# Patient Record
Sex: Female | Born: 1970 | Race: Black or African American | Hispanic: No | Smoking: Never smoker
Health system: Southern US, Community
[De-identification: ages and names within clinical notes are randomized; demographics above are authoritative.]

## PROBLEM LIST (undated history)

## (undated) DIAGNOSIS — I10 Essential (primary) hypertension: Secondary | ICD-10-CM

## (undated) DIAGNOSIS — IMO0002 Reserved for concepts with insufficient information to code with codable children: Secondary | ICD-10-CM

## (undated) DIAGNOSIS — R7303 Prediabetes: Secondary | ICD-10-CM

## (undated) DIAGNOSIS — N939 Abnormal uterine and vaginal bleeding, unspecified: Secondary | ICD-10-CM

## (undated) DIAGNOSIS — B009 Herpesviral infection, unspecified: Secondary | ICD-10-CM

## (undated) DIAGNOSIS — Z973 Presence of spectacles and contact lenses: Secondary | ICD-10-CM

## (undated) DIAGNOSIS — R87619 Unspecified abnormal cytological findings in specimens from cervix uteri: Secondary | ICD-10-CM

## (undated) HISTORY — PX: WISDOM TOOTH EXTRACTION: SHX21

## (undated) HISTORY — DX: Prediabetes: R73.03

## (undated) HISTORY — DX: Reserved for concepts with insufficient information to code with codable children: IMO0002

## (undated) HISTORY — DX: Herpesviral infection, unspecified: B00.9

## (undated) HISTORY — DX: Unspecified abnormal cytological findings in specimens from cervix uteri: R87.619

---

## 1999-08-17 DIAGNOSIS — S060X9A Concussion with loss of consciousness of unspecified duration, initial encounter: Secondary | ICD-10-CM

## 1999-08-17 DIAGNOSIS — S060XAA Concussion with loss of consciousness status unknown, initial encounter: Secondary | ICD-10-CM

## 1999-08-17 HISTORY — DX: Concussion with loss of consciousness status unknown, initial encounter: S06.0XAA

## 1999-08-17 HISTORY — DX: Concussion with loss of consciousness of unspecified duration, initial encounter: S06.0X9A

## 2004-05-19 ENCOUNTER — Other Ambulatory Visit: Admission: RE | Admit: 2004-05-19 | Discharge: 2004-05-19 | Payer: Self-pay | Admitting: Obstetrics and Gynecology

## 2005-06-10 ENCOUNTER — Other Ambulatory Visit: Admission: RE | Admit: 2005-06-10 | Discharge: 2005-06-10 | Payer: Self-pay | Admitting: Obstetrics and Gynecology

## 2006-07-19 ENCOUNTER — Other Ambulatory Visit: Admission: RE | Admit: 2006-07-19 | Discharge: 2006-07-19 | Payer: Self-pay | Admitting: Obstetrics & Gynecology

## 2007-07-24 ENCOUNTER — Other Ambulatory Visit: Admission: RE | Admit: 2007-07-24 | Discharge: 2007-07-24 | Payer: Self-pay | Admitting: Obstetrics and Gynecology

## 2007-08-21 ENCOUNTER — Emergency Department (HOSPITAL_COMMUNITY): Admission: EM | Admit: 2007-08-21 | Discharge: 2007-08-21 | Payer: Self-pay | Admitting: Family Medicine

## 2007-08-24 ENCOUNTER — Emergency Department (HOSPITAL_COMMUNITY): Admission: EM | Admit: 2007-08-24 | Discharge: 2007-08-24 | Payer: Self-pay | Admitting: Emergency Medicine

## 2008-08-23 ENCOUNTER — Emergency Department (HOSPITAL_COMMUNITY): Admission: EM | Admit: 2008-08-23 | Discharge: 2008-08-23 | Payer: Self-pay | Admitting: Family Medicine

## 2008-08-28 ENCOUNTER — Other Ambulatory Visit: Admission: RE | Admit: 2008-08-28 | Discharge: 2008-08-28 | Payer: Self-pay | Admitting: Obstetrics and Gynecology

## 2011-05-06 LAB — DIFFERENTIAL
Basophils Absolute: 0
Basophils Relative: 0
Lymphocytes Relative: 25
Monocytes Relative: 11
Neutro Abs: 3.8
Neutrophils Relative %: 63

## 2011-05-06 LAB — STREP A DNA PROBE: Group A Strep Probe: NEGATIVE

## 2011-05-06 LAB — CBC
MCHC: 34.3
RBC: 4.33
RDW: 12.8

## 2011-05-06 LAB — POCT RAPID STREP A
Streptococcus, Group A Screen (Direct): NEGATIVE
Streptococcus, Group A Screen (Direct): NEGATIVE

## 2012-05-09 ENCOUNTER — Other Ambulatory Visit: Payer: Self-pay | Admitting: Obstetrics and Gynecology

## 2012-05-09 DIAGNOSIS — Z1231 Encounter for screening mammogram for malignant neoplasm of breast: Secondary | ICD-10-CM

## 2012-05-12 ENCOUNTER — Ambulatory Visit
Admission: RE | Admit: 2012-05-12 | Discharge: 2012-05-12 | Disposition: A | Discharge status: Home / Self Care | Payer: 59 | Source: Ambulatory Visit | Attending: Obstetrics and Gynecology | Admitting: Obstetrics and Gynecology

## 2012-05-12 DIAGNOSIS — Z1231 Encounter for screening mammogram for malignant neoplasm of breast: Secondary | ICD-10-CM

## 2012-11-08 ENCOUNTER — Encounter: Payer: Self-pay | Admitting: *Deleted

## 2012-11-27 ENCOUNTER — Ambulatory Visit (INDEPENDENT_AMBULATORY_CARE_PROVIDER_SITE_OTHER): Payer: 59 | Admitting: Nurse Practitioner

## 2012-11-27 ENCOUNTER — Encounter: Payer: Self-pay | Admitting: Nurse Practitioner

## 2012-11-27 VITALS — BP 128/80 | HR 72 | Resp 18 | Ht 67.0 in | Wt 209.0 lb

## 2012-11-27 DIAGNOSIS — Z0000 Encounter for general adult medical examination without abnormal findings: Secondary | ICD-10-CM

## 2012-11-27 DIAGNOSIS — Z01419 Encounter for gynecological examination (general) (routine) without abnormal findings: Secondary | ICD-10-CM

## 2012-11-27 DIAGNOSIS — E559 Vitamin D deficiency, unspecified: Secondary | ICD-10-CM

## 2012-11-27 DIAGNOSIS — Z309 Encounter for contraceptive management, unspecified: Secondary | ICD-10-CM

## 2012-11-27 LAB — POCT URINALYSIS DIPSTICK
Leukocytes, UA: NEGATIVE
Nitrite, UA: NEGATIVE
Protein, UA: NEGATIVE

## 2012-11-27 MED ORDER — NORETHIN-ETH ESTRAD TRIPHASIC 0.5/0.75/1-35 MG-MCG PO TABS: 1.0000 | ORAL_TABLET | Freq: Every day | ORAL | Status: DC

## 2012-11-27 MED ORDER — VITAMIN D (ERGOCALCIFEROL) 1.25 MG (50000 UNIT) PO CAPS: 50000.0000 [IU] | ORAL_CAPSULE | ORAL | Status: DC

## 2012-11-27 NOTE — Progress Notes (Signed)
Patient to call back and verify the telephone number and or address for [employer] that she uses as her Erx failed to transmit during the visit. 

## 2012-11-27 NOTE — Progress Notes (Signed)
Patient to call back and verify the telephone number and or address for [employer] that she uses as her Erx failed to transmit during the visit.

## 2012-11-27 NOTE — Progress Notes (Signed)
42 y.o. G3P0010 Single African American Fe here for annual exam.   Pt. Reports no new problems.  Menses is regular and light.  Same partner for about 11 years. Daughter will graduate High School this year , considering college at [employer].  Patient's last menstrual period was 11/14/2012.          Sexually active: yes  The current method of family planning is OCP (estrogen/progesterone).    Exercising: no  The patient does not participate in regular exercise at present. Smoker:  no  Health Maintenance: Pap:  4/13 normal with neg HR HPV MMG:  9/13 neg Colonoscopy:  NA BMD:   NA TDaP:  07/24/07 Labs: HGB, UA, Vit D   reports that she has never smoked. She has never used smokeless tobacco. She reports that she does not drink alcohol or use illicit drugs.  Past Medical History  Diagnosis Date  . Abnormal pap   . HSV infection     Asymptomatic    History reviewed. No pertinent past surgical history.  Current Outpatient Prescriptions  Medication Sig Dispense Refill  . norethindrone-ethinyl estradiol (TRIPHASIL,CYCLAFEM,ALYACEN) 0.5/0.75/1-35 MG-MCG tablet Take 1 tablet by mouth daily.      . Vitamin D, Ergocalciferol, (DRISDOL) 50000 UNITS CAPS Take 50,000 Units by mouth.       No current facility-administered medications for this visit.    Family History  Problem Relation Age of Onset  . Diabetes Mother   . Hypertension Mother   . Heart disease Mother   . COPD Mother   . Diabetes Father   . Diabetes Maternal Grandmother   . Hypertension Maternal Grandmother   . Heart disease Maternal Grandmother   . Heart disease Paternal Grandfather   . Heart failure Paternal Grandfather     ROS:  Pertinent items are noted in HPI.  Otherwise, a comprehensive ROS was negative.  Exam:   BP 128/80  Pulse 72  Resp 18  Ht 5\' 7"  (1.702 m)  Wt 209 lb (94.802 kg)  BMI 32.73 kg/m2  LMP 11/14/2012  Height: 5\' 7"  (170.2 cm)  Ht Readings from Last 3 Encounters:  11/27/12 5\' 7"  (1.702 m)     General appearance: alert, cooperative and appears stated age Head: Normocephalic, without obvious abnormality, atraumatic Neck: no adenopathy, supple, symmetrical, trachea midline and thyroid normal to inspection and palpation Lungs: clear to auscultation bilaterally Breasts: normal appearance, no masses or tenderness Heart: regular rate and rhythm Abdomen: soft, non-tender; no masses,  no organomegaly Extremities: extremities normal, atraumatic, no cyanosis or edema Skin: Skin color, texture, turgor normal. No rashes or lesions Lymph nodes: Cervical, supraclavicular, and axillary nodes normal. No abnormal inguinal nodes palpated Neurologic: Grossly normal   Pelvic: External genitalia:  no lesions              Urethra:  normal appearing urethra with no masses, tenderness or lesions              Bartholin's and Skene's: normal                 Vagina: normal appearing vagina with normal color and discharge, no lesions              Cervix: anteverted              Pap taken: no Bimanual Exam:  Uterus:  normal size, contour, position, consistency, mobility, non-tender              Adnexa: no mass, fullness, tenderness  Rectovaginal: Confirms               Anus:  normal sphincter tone, no lesions  A:  Well Woman with normal exam  No contraindications to continued OCP use  Vit D Def.  P:   Mammogram due 04/2013  pap smear as per guidelines  RF OCP and Vit D  return annually or prn  An After Visit Summary was printed and given to the patient.

## 2012-11-27 NOTE — Patient Instructions (Signed)

## 2012-11-28 ENCOUNTER — Telehealth: Payer: Self-pay | Admitting: *Deleted

## 2012-11-28 NOTE — Telephone Encounter (Signed)
Pt. Aware of vitamin d results and will begin vitamin d (otc) 800 IU po qd.

## 2012-11-28 NOTE — Telephone Encounter (Signed)
Message copied by Osie Bond on Tue Nov 28, 2012  4:21 PM ------      Message from: Ria Comment R      Created: Tue Nov 28, 2012  4:16 PM       Follow vit d protocol, let pt know, has a new rx given yesterday ------

## 2012-11-28 NOTE — Progress Notes (Signed)
Encounter reviewed by Dr. Brook Silva.  

## 2012-11-30 ENCOUNTER — Telehealth: Payer: Self-pay | Admitting: *Deleted

## 2012-11-30 LAB — HEMOGLOBIN, FINGERSTICK: Hemoglobin, fingerstick: 12.7 g/dL (ref 12.0–16.0)

## 2012-11-30 MED ORDER — METRONIDAZOLE 0.75 % EX GEL: CUTANEOUS | Status: DC

## 2012-11-30 NOTE — Telephone Encounter (Signed)
Call to [facility] for Rx for Metrogel 0.75% 1 applicatorful PV QHS x 5 days. Disp 1 tube with 0 RF.

## 2012-11-30 NOTE — Telephone Encounter (Signed)
Spoke with pt about Dr Rondel Baton recommendation about Metrogel. Instructed pt to use an applicatorful every night vaginally at bedtime for 5 days. Pt would [facility] [address]

## 2012-11-30 NOTE — Telephone Encounter (Signed)
Pt married.  Ok to call in RX for metrogel 0.75% one applicator QHS x 5 days.  0RF.  If symptoms do not resolve, will need OV.

## 2012-11-30 NOTE — Telephone Encounter (Signed)
Patient was seen past Monday for AEX by Shirlyn Goltz, FNP, . Patient called this morning states with brown vaginal discharge with fish odor. No itching , no burning with urination, states has a history of bacterial vaginitis. Please advise. sue

## 2012-12-04 ENCOUNTER — Encounter: Payer: Self-pay | Admitting: *Deleted

## 2012-12-05 NOTE — Progress Notes (Signed)
Patient states se spoke to Tiffany and [facility] her 

## 2012-12-05 NOTE — Progress Notes (Signed)
Patient states se spoke to [place] and took [facility] with her

## 2012-12-18 ENCOUNTER — Ambulatory Visit (INDEPENDENT_AMBULATORY_CARE_PROVIDER_SITE_OTHER): Payer: 59 | Admitting: Nurse Practitioner

## 2012-12-18 ENCOUNTER — Encounter: Payer: Self-pay | Admitting: Nurse Practitioner

## 2012-12-18 VITALS — BP 102/58 | HR 64 | Resp 14 | Wt 212.2 lb

## 2012-12-18 DIAGNOSIS — B9689 Other specified bacterial agents as the cause of diseases classified elsewhere: Secondary | ICD-10-CM

## 2012-12-18 DIAGNOSIS — N760 Acute vaginitis: Secondary | ICD-10-CM

## 2012-12-18 DIAGNOSIS — A499 Bacterial infection, unspecified: Secondary | ICD-10-CM

## 2012-12-18 LAB — POCT WET PREP (WET MOUNT)

## 2012-12-18 MED ORDER — METRONIDAZOLE 500 MG PO TABS: 500.0000 mg | ORAL_TABLET | Freq: Two times a day (BID) | ORAL | Status: DC

## 2012-12-18 MED ORDER — METRONIDAZOLE 0.75 % VA GEL: 1.0000 | Freq: Every day | VAGINAL | Status: DC

## 2012-12-18 NOTE — Patient Instructions (Signed)
Bacterial Vaginosis Bacterial vaginosis (BV) is a vaginal infection where the normal balance of bacteria in the vagina is disrupted. The normal balance is then replaced by an overgrowth of certain bacteria. There are several different kinds of bacteria that can cause BV. BV is the most common vaginal infection in women of childbearing age. CAUSES   The cause of BV is not fully understood. BV develops when there is an increase or imbalance of harmful bacteria.  Some activities or behaviors can upset the normal balance of bacteria in the vagina and put women at increased risk including:  Having a new sex partner or multiple sex partners.  Douching.  Using an intrauterine device (IUD) for contraception.  It is not clear what role sexual activity plays in the development of BV. However, women that have never had sexual intercourse are rarely infected with BV. Women do not get BV from toilet seats, bedding, swimming pools or from touching objects around them.  SYMPTOMS   Grey vaginal discharge.  A fish-like odor with discharge, especially after sexual intercourse.  Itching or burning of the vagina and vulva.  Burning or pain with urination.  Some women have no signs or symptoms at all. DIAGNOSIS  Your caregiver must examine the vagina for signs of BV. Your caregiver will perform lab tests and look at the sample of vaginal fluid through a microscope. They will look for bacteria and abnormal cells (clue cells), a pH test higher than 4.5, and a positive amine test all associated with BV.  RISKS AND COMPLICATIONS   Pelvic inflammatory disease (PID).  Infections following gynecology surgery.  Developing HIV.  Developing herpes virus. TREATMENT  Sometimes BV will clear up without treatment. However, all women with symptoms of BV should be treated to avoid complications, especially if gynecology surgery is planned. Female partners generally do not need to be treated. However, BV may spread  between female sex partners so treatment is helpful in preventing a recurrence of BV.   BV may be treated with antibiotics. The antibiotics come in either pill or vaginal cream forms. Either can be used with nonpregnant or pregnant women, but the recommended dosages differ. These antibiotics are not harmful to the baby.  BV can recur after treatment. If this happens, a second round of antibiotics will often be prescribed.  Treatment is important for pregnant women. If not treated, BV can cause a premature delivery, especially for a pregnant woman who had a premature birth in the past. All pregnant women who have symptoms of BV should be checked and treated.  For chronic reoccurrence of BV, treatment with a type of prescribed gel vaginally twice a week is helpful. HOME CARE INSTRUCTIONS   Finish all medication as directed by your caregiver.  Do not have sex until treatment is completed.  Tell your sexual partner that you have a vaginal infection. They should see their caregiver and be treated if they have problems, such as a mild rash or itching.  Practice safe sex. Use condoms. Only have 1 sex partner. PREVENTION  Basic prevention steps can help reduce the risk of upsetting the natural balance of bacteria in the vagina and developing BV:  Do not have sexual intercourse (be abstinent).  Do not douche.  Use all of the medicine prescribed for treatment of BV, even if the signs and symptoms go away.  Tell your sex partner if you have BV. That way, they can be treated, if needed, to prevent reoccurrence. SEEK MEDICAL CARE IF:     Your symptoms are not improving after 3 days of treatment.  You have increased discharge, pain, or fever. MAKE SURE YOU:   Understand these instructions.  Will watch your condition.  Will get help right away if you are not doing well or get worse. FOR MORE INFORMATION  Division of STD Prevention (DSTDP), Centers for Disease Control and Prevention:  www.cdc.gov/std American Social Health Association (ASHA): www.ashastd.org  Document Released: 08/02/2005 Document Revised: 10/25/2011 Document Reviewed: 01/23/2009 ExitCare Patient Information 2013 [employer].  

## 2012-12-18 NOTE — Progress Notes (Signed)
Subjective:     Patient ID: Sarah Keller, female   DOB: [birth year], 42 y.o.   MRN: [id number]  Vaginal Discharge The patient's primary symptoms include a genital odor and a vaginal discharge. The patient's pertinent negatives include no genital itching, genital lesions, genital rash, missed menses, pelvic pain or vaginal bleeding. Primary symptoms comment: Vagina Symptoms for 1 week.  More symptoms of BV than yeast. With malodorous  discharge and  thin discharge.. This is a recurrent problem. The current episode started in the past 7 days. The problem has been gradually worsening. The patient is experiencing no pain. She is not pregnant. Pertinent negatives include no abdominal pain, anorexia, back pain, chills, constipation, diarrhea, discolored urine, dysuria, fever, flank pain, frequency, headaches, hematuria, joint pain, joint swelling, nausea, painful intercourse, rash, sore throat, urgency or vomiting. The vaginal discharge was clear, copious, grey, malodorous, mucoid, thick, watery and yellow. Vaginal bleeding amount: LMP 12/12/12 normal. She has not been passing clots (On ON 777 for birth control.). Treatments tried: Metrogel recently 11/24/12 without resolution of symptoms and only mild relief. She is sexually active. No, her partner does not have an STD. She uses oral contraceptives for contraception. Her menstrual history has been regular. Her past medical history is significant for an STD and vaginosis. (History of HSV and frequent BV. Partner has been treated in past but did not seem to make a difference in her recurrent symptoms. )     Review of Systems  Constitutional: Negative.  Negative for fever and chills.  HENT: Negative for sore throat.   Respiratory: Negative.   Cardiovascular: Negative.   Gastrointestinal: Negative.  Negative for nausea, vomiting, abdominal pain, diarrhea, constipation and anorexia.  Endocrine: Negative.   Genitourinary: Positive for vaginal discharge. Negative  for dysuria, urgency, frequency, hematuria, flank pain, pelvic pain and missed menses.  Musculoskeletal: Negative for back pain and joint pain.  Skin: Negative for rash.  Neurological: Negative for headaches.  Psychiatric/Behavioral: Negative.        Objective:   Physical Exam  Constitutional: She is oriented to person, place, and time. She appears well-developed and well-nourished.  Cardiovascular: Normal rate.   Pulmonary/Chest: Effort normal.  Abdominal: Soft. She exhibits no distension and no mass. There is no tenderness. There is no rebound.  Genitourinary: Vaginal discharge found.  White thin creamy vaginal discharge. No lesions.  Musculoskeletal: Normal range of motion.  Neurological: She is alert and oriented to person, place, and time.  Skin: Skin is warm and dry.  Psychiatric: She has a normal mood and affect. Her behavior is normal. Judgment and thought content normal.       Assessment:    BV acute and chronic Same partner for years    Plan:     Patient does better with Flagyl po- 1 tab 2 day for 1 week. Aware of ETOH and GI precautions. Since this is a prolonged problem for her will try Metrogel after sexual activity to see if helps to keep symptoms from recurring. RTO prn

## 2012-12-20 NOTE — Progress Notes (Signed)
Encounter reviewed by Dr. Brook Silva.  

## 2013-01-29 ENCOUNTER — Other Ambulatory Visit: Payer: Self-pay | Admitting: *Deleted

## 2013-01-29 DIAGNOSIS — Z309 Encounter for contraceptive management, unspecified: Secondary | ICD-10-CM

## 2013-01-29 MED ORDER — NORETHIN-ETH ESTRAD TRIPHASIC 0.5/0.75/1-35 MG-MCG PO TABS: 1.0000 | ORAL_TABLET | Freq: Every day | ORAL | Status: DC

## 2013-01-29 NOTE — Telephone Encounter (Signed)
Faxed refill request received from pharmacy for [facility] Last filled by MD on 11/27/12 Last AEX - 11/27/12 Next AEX - 11/28/13 Pharmacy did not receive RX sent during AEX.  Refilled x 1 year per last AEX note.

## 2013-05-07 ENCOUNTER — Other Ambulatory Visit: Payer: Self-pay

## 2013-05-07 DIAGNOSIS — Z1231 Encounter for screening mammogram for malignant neoplasm of breast: Secondary | ICD-10-CM

## 2013-05-30 ENCOUNTER — Ambulatory Visit
Admission: RE | Admit: 2013-05-30 | Discharge: 2013-05-30 | Disposition: A | Discharge status: Home / Self Care | Payer: 59 | Source: Ambulatory Visit

## 2013-05-30 DIAGNOSIS — Z1231 Encounter for screening mammogram for malignant neoplasm of breast: Secondary | ICD-10-CM

## 2013-06-04 ENCOUNTER — Other Ambulatory Visit: Payer: Self-pay | Admitting: Obstetrics & Gynecology

## 2013-06-04 DIAGNOSIS — R928 Other abnormal and inconclusive findings on diagnostic imaging of breast: Secondary | ICD-10-CM

## 2013-06-19 ENCOUNTER — Ambulatory Visit
Admission: RE | Admit: 2013-06-19 | Discharge: 2013-06-19 | Disposition: A | Discharge status: Home / Self Care | Payer: Self-pay | Source: Ambulatory Visit | Attending: Obstetrics & Gynecology | Admitting: Obstetrics & Gynecology

## 2013-06-19 ENCOUNTER — Telehealth: Payer: Self-pay | Admitting: Obstetrics & Gynecology

## 2013-06-19 ENCOUNTER — Other Ambulatory Visit: Payer: Self-pay | Admitting: Orthopedic Surgery

## 2013-06-19 DIAGNOSIS — R928 Other abnormal and inconclusive findings on diagnostic imaging of breast: Secondary | ICD-10-CM

## 2013-06-19 NOTE — Telephone Encounter (Signed)
Baird Lyons from the breast center is calling about a referral for a mammogram. She needs miller to sign the order for the patient either electronically or on the fax she sent. She said she sent a ultrasound with it and that one was signed but not the mammogram.

## 2013-06-19 NOTE — Telephone Encounter (Signed)
Dr. Hyacinth Meeker,  Could you sign MMG order?

## 2013-06-20 NOTE — Telephone Encounter (Signed)
Mammogram was completed yesterday.

## 2013-08-31 ENCOUNTER — Ambulatory Visit (INDEPENDENT_AMBULATORY_CARE_PROVIDER_SITE_OTHER): Payer: BC Managed Care – PPO | Admitting: Obstetrics & Gynecology

## 2013-08-31 ENCOUNTER — Encounter: Payer: Self-pay | Admitting: Obstetrics & Gynecology

## 2013-08-31 VITALS — BP 118/62 | HR 68 | Resp 16 | Ht 67.0 in | Wt 216.2 lb

## 2013-08-31 DIAGNOSIS — N390 Urinary tract infection, site not specified: Secondary | ICD-10-CM

## 2013-08-31 LAB — POCT URINALYSIS DIPSTICK
BILIRUBIN UA: NEGATIVE
GLUCOSE UA: NEGATIVE
Ketones, UA: NEGATIVE
Leukocytes, UA: NEGATIVE
Nitrite, UA: NEGATIVE
Protein, UA: NEGATIVE
UROBILINOGEN UA: NEGATIVE
pH, UA: 5

## 2013-08-31 MED ORDER — FLUCONAZOLE 150 MG PO TABS: 150.0000 mg | ORAL_TABLET | Freq: Once | ORAL | Status: DC

## 2013-08-31 MED ORDER — SULFAMETHOXAZOLE-TRIMETHOPRIM 800-160 MG PO TABS: 1.0000 | ORAL_TABLET | Freq: Two times a day (BID) | ORAL | Status: DC

## 2013-08-31 NOTE — Progress Notes (Signed)
Patient ID: Sarah Keller, female   DOB: 1971/03/02, 43 y.o.   MRN: [id number]  S:  43 y.o.Single African American female presents with 10 day hx of increased frequency, urgency, and dysuria.  She was seen at urgent care at [facility] (I can't see records) and was treated with Cipro 250mg  bid x 5 days.  Part of the symptoms resolved but pt is still having burning and this worsened right after stopping the cipro.  No back pain.  No fevers.  No vaginal discharge.    ROS: no weight loss, fever, night sweats and feels well  O Gen:  alert, oriented to person, place, and time   Abd:  Soft, nontender, no masses  No CVA tenderness   Gyn:  NAEFG, vaginal without discharge or lesions  Assessment:  partially treated UTI?  Plan:  Urine culture and urine micro  Bactrim DS BID x 7 days Diflucan 150mg  po x 1 repeat 72 hrs prn

## 2013-08-31 NOTE — Patient Instructions (Signed)
Urinary Tract Infection  Urinary tract infections (UTIs) can develop anywhere along your urinary tract. Your urinary tract is your body's drainage system for removing wastes and extra water. Your urinary tract includes two kidneys, two ureters, a bladder, and a urethra. Your kidneys are a pair of bean-shaped organs. Each kidney is about the size of your fist. They are located below your ribs, one on each side of your spine.  CAUSES  Infections are caused by microbes, which are microscopic organisms, including fungi, viruses, and bacteria. These organisms are so small that they can only be seen through a microscope. Bacteria are the microbes that most commonly cause UTIs.  SYMPTOMS   Symptoms of UTIs may vary by age and gender of the patient and by the location of the infection. Symptoms in young women typically include a frequent and intense urge to urinate and a painful, burning feeling in the bladder or urethra during urination. Older women and men are more likely to be tired, shaky, and weak and have muscle aches and abdominal pain. A fever may mean the infection is in your kidneys. Other symptoms of a kidney infection include pain in your back or sides below the ribs, nausea, and vomiting.  DIAGNOSIS  To diagnose a UTI, your caregiver will ask you about your symptoms. Your caregiver also will ask to provide a urine sample. The urine sample will be tested for bacteria and white blood cells. White blood cells are made by your body to help fight infection.  TREATMENT   Typically, UTIs can be treated with medication. Because most UTIs are caused by a bacterial infection, they usually can be treated with the use of antibiotics. The choice of antibiotic and length of treatment depend on your symptoms and the type of bacteria causing your infection.  HOME CARE INSTRUCTIONS   If you were prescribed antibiotics, take them exactly as your caregiver instructs you. Finish the medication even if you feel better after you  have only taken some of the medication.   Drink enough water and fluids to keep your urine clear or pale yellow.   Avoid caffeine, tea, and carbonated beverages. They tend to irritate your bladder.   Empty your bladder often. Avoid holding urine for long periods of time.   Empty your bladder before and after sexual intercourse.   After a bowel movement, women should cleanse from front to back. Use each tissue only once.  SEEK MEDICAL CARE IF:    You have back pain.   You develop a fever.   Your symptoms do not begin to resolve within 3 days.  SEEK IMMEDIATE MEDICAL CARE IF:    You have severe back pain or lower abdominal pain.   You develop chills.   You have nausea or vomiting.   You have continued burning or discomfort with urination.  MAKE SURE YOU:    Understand these instructions.   Will watch your condition.   Will get help right away if you are not doing well or get worse.  Document Released: 05/12/2005 Document Revised: 02/01/2012 Document Reviewed: 09/10/2011  ExitCare Patient Information 2014 [employer], [employer].

## 2013-09-01 LAB — URINALYSIS, MICROSCOPIC ONLY
Bacteria, UA: NONE SEEN
CASTS: NONE SEEN
Crystals: NONE SEEN
SQUAMOUS EPITHELIAL / LPF: NONE SEEN

## 2013-09-02 LAB — URINE CULTURE
COLONY COUNT: NO GROWTH
ORGANISM ID, BACTERIA: NO GROWTH

## 2013-11-28 ENCOUNTER — Ambulatory Visit (INDEPENDENT_AMBULATORY_CARE_PROVIDER_SITE_OTHER): Payer: BC Managed Care – PPO | Admitting: Nurse Practitioner

## 2013-11-28 ENCOUNTER — Encounter: Payer: Self-pay | Admitting: Nurse Practitioner

## 2013-11-28 VITALS — BP 120/72 | HR 80 | Resp 20 | Ht 67.0 in | Wt 207.0 lb

## 2013-11-28 DIAGNOSIS — N921 Excessive and frequent menstruation with irregular cycle: Secondary | ICD-10-CM

## 2013-11-28 DIAGNOSIS — Z0000 Encounter for general adult medical examination without abnormal findings: Secondary | ICD-10-CM

## 2013-11-28 DIAGNOSIS — Z309 Encounter for contraceptive management, unspecified: Secondary | ICD-10-CM

## 2013-11-28 DIAGNOSIS — R319 Hematuria, unspecified: Secondary | ICD-10-CM

## 2013-11-28 DIAGNOSIS — E559 Vitamin D deficiency, unspecified: Secondary | ICD-10-CM

## 2013-11-28 DIAGNOSIS — Z01419 Encounter for gynecological examination (general) (routine) without abnormal findings: Secondary | ICD-10-CM

## 2013-11-28 LAB — POCT URINALYSIS DIPSTICK
Bilirubin, UA: NEGATIVE
Glucose, UA: NEGATIVE
Ketones, UA: NEGATIVE
Leukocytes, UA: NEGATIVE
Nitrite, UA: NEGATIVE
PH UA: 6
UROBILINOGEN UA: NEGATIVE

## 2013-11-28 LAB — HEMOGLOBIN, FINGERSTICK: HEMOGLOBIN, FINGERSTICK: 12 g/dL (ref 12.0–16.0)

## 2013-11-28 MED ORDER — NORETHIN-ETH ESTRAD TRIPHASIC 0.5/0.75/1-35 MG-MCG PO TABS: 1.0000 | ORAL_TABLET | Freq: Every day | ORAL | Status: DC

## 2013-11-28 NOTE — Progress Notes (Signed)
Patient ID: Sarah Keller, female   DOB: 1971-04-25, 43 y.o.   MRN: [id number] 43 y.o. G3P0010 Single African American Fe here for annual exam.  Menses at 5 days.  With spotting at 2nd or 3 rd week of pill pack without missed or late OCP. Same partner for 12 years. Denies urinary symptoms. Noted spotting this past 1-2 days.  Patient's last menstrual period was 11/13/2013.          Sexually active: yes  The current method of family planning is OCP (estrogen/progesterone).    Exercising: no  The patient does not participate in regular exercise at present. Smoker:  no  Health Maintenance: Pap: 11/23/11 WNL, neg HR HPV  MMG:  06/19/13, Bi-Rads 1: negative  TDaP: 07/24/07  Labs: HB: 12.0 Urine:  Large RBC    reports that she has never smoked. She has never used smokeless tobacco. She reports that she does not drink alcohol or use illicit drugs.  Past Medical History  Diagnosis Date  . Abnormal pap   . HSV infection     Asymptomatic    Past Surgical History  Procedure Laterality Date  . Cesarean section  04/24/2003  . Wisdom tooth extraction  early 20's    Current Outpatient Prescriptions  Medication Sig Dispense Refill  . cholecalciferol (VITAMIN D) 1000 UNITS tablet Take 1,000 Units by mouth daily.      . norethindrone-ethinyl estradiol (TRIPHASIL,CYCLAFEM,ALYACEN) 0.5/0.75/1-35 MG-MCG tablet Take 1 tablet by mouth daily.  3 Package  3   No current facility-administered medications for this visit.    Family History  Problem Relation Age of Onset  . Diabetes Mother   . Hypertension Mother   . Heart disease Mother   . COPD Mother   . Diabetes Father   . Diabetes Maternal Grandmother   . Hypertension Maternal Grandmother   . Heart disease Maternal Grandmother   . Heart failure Maternal Grandfather   . Heart disease Maternal Grandfather     ROS:  Pertinent items are noted in HPI.  Otherwise, a comprehensive ROS was negative.  Exam:   BP 120/72  Pulse 80  Resp 20  Ht 5\' 7"   (1.702 m)  Wt 207 lb (93.895 kg)  BMI 32.41 kg/m2  LMP 11/13/2013 Height: 5\' 7"  (170.2 cm)  Ht Readings from Last 3 Encounters:  11/28/13 5\' 7"  (1.702 m)  08/31/13 5\' 7"  (1.702 m)  11/27/12 5\' 7"  (1.702 m)    General appearance: alert, cooperative and appears stated age Head: Normocephalic, without obvious abnormality, atraumatic Neck: no adenopathy, supple, symmetrical, trachea midline and thyroid normal to inspection and palpation Lungs: clear to auscultation bilaterally Breasts: normal appearance, no masses or tenderness Heart: regular rate and rhythm Abdomen: soft, non-tender; no masses,  no organomegaly Extremities: extremities normal, atraumatic, no cyanosis or edema Skin: Skin color, texture, turgor normal. No rashes or lesions Lymph nodes: Cervical, supraclavicular, and axillary nodes normal. No abnormal inguinal nodes palpated Neurologic: Grossly normal   Pelvic: External genitalia:  no lesions              Urethra:  normal appearing urethra with no masses, tenderness or lesions              Bartholin's and Skene's: normal                 Vagina: normal appearing vagina with normal color and discharge, no lesions              Cervix: anteverted  Pap taken: no Bimanual Exam:  Uterus:  normal size, contour, position, consistency, mobility, non-tender              Adnexa: no mass, fullness, tenderness               Rectovaginal: Confirms               Anus:  normal sphincter tone, no lesions  A:  Well Woman with normal exam  OCP for Contraception  Intermenstrual bleeding for about a year - not usually related to SA  Hematuria ? Related to vaginal bleeding  History of Vit D deficiency    P:   Pap smear as per guidelines   Mammogram due 11/15  Refill OCP  Ortho Novum 777 for a year - she will call us back with name of mail order pharmacy  Will get PUS/ [facility] and evaluate BTB since this is ongoing for a year  She will return for fasting labs  Counseled on  breast self exam, mammography screening, use and side effects of OCP's, adequate intake of calcium and vitamin D, diet and exercise, Kegel's exercises return annually or prn  An After Visit Summary was printed and given to the patient.

## 2013-11-28 NOTE — Patient Instructions (Signed)

## 2013-11-29 ENCOUNTER — Telehealth: Payer: Self-pay | Admitting: Obstetrics & Gynecology

## 2013-11-29 LAB — URINALYSIS, MICROSCOPIC ONLY
BACTERIA UA: NONE SEEN
CASTS: NONE SEEN
Crystals: NONE SEEN
RBC / HPF: NONE SEEN RBC/hpf (ref <3)
Squamous Epithelial / LPF: NONE SEEN
WBC UA: NONE SEEN WBC/hpf (ref <3)

## 2013-11-29 LAB — URINE CULTURE
COLONY COUNT: NO GROWTH
Organism ID, Bacteria: NO GROWTH

## 2013-11-29 LAB — VITAMIN D 25 HYDROXY (VIT D DEFICIENCY, FRACTURES): VIT D 25 HYDROXY: 37 ng/mL (ref 30–89)

## 2013-11-29 NOTE — Telephone Encounter (Signed)
Left message to call [facility] at [phone].  Last office visit on 4/15 with [name], [place]. Ortho Novum 174 to be refilled to mail order pharmacy. Ortho Novum 944 sent in to [facility] on 4/15 by [name], FNP dispense 3 with 3 refills. Patient has 3 refills remaining. Calling patient to ask if she would like remaining refills be sent over to [facility] as previously noted by [name], FNP during office visit or if she would like to leave prescription at [facility]. [facility] and [facility] are both entered in patients pharmacy list already.

## 2013-11-29 NOTE — Telephone Encounter (Signed)
Patient was told to call with pharmacy info. Patient says her local pharmacy is still [facility] and her mail order is [facility].

## 2013-11-29 NOTE — Telephone Encounter (Signed)
Spoke with patient. Advised of $30 copay quoted by insurance for [facility] [place]. Scheduled [facility]. Advised patient of cancellation policy/fee. Mailed the In-Office procedure form that includes appointment date and time, patient copay, and cancellation policy.

## 2013-11-30 MED ORDER — NORETHIN-ETH ESTRAD TRIPHASIC 0.5/0.75/1-35 MG-MCG PO TABS: 1.0000 | ORAL_TABLET | Freq: Every day | ORAL | Status: DC

## 2013-11-30 NOTE — Progress Notes (Signed)
Encounter reviewed by Dr. Shanetta Nicolls Silva.  

## 2013-11-30 NOTE — Telephone Encounter (Signed)
Spoke with patient. Patient would like prescription to be sent to [facility] instead of [facility]. New order sent over. Patient agreeable and verbalizes understanding.  Routing to provider for final review. Patient agreeable to disposition. Will close encounter

## 2013-12-03 ENCOUNTER — Telehealth: Payer: Self-pay | Admitting: Nurse Practitioner

## 2013-12-03 NOTE — Telephone Encounter (Signed)
Patient is calling Amy back about results i dont see a message though

## 2013-12-03 NOTE — Telephone Encounter (Signed)
Spoke with patient. Results given as seen below from [name], [place]. Patient verbalizes understanding.   Notes Recorded by Oleta Mouse, RN on 12/03/2013 at 10:34 AM [facility] for results. ------  Notes Recorded by [name], FNP on 11/29/2013 at 9:26 PM Let patient know that urine culture was negative and no RBC on micro -we had large RBC here. She will be relieved that no hematuria.   Routing to provider for final review. Patient agreeable to disposition. Will close encounter

## 2013-12-04 ENCOUNTER — Other Ambulatory Visit (INDEPENDENT_AMBULATORY_CARE_PROVIDER_SITE_OTHER): Payer: BC Managed Care – PPO

## 2013-12-04 DIAGNOSIS — Z0000 Encounter for general adult medical examination without abnormal findings: Secondary | ICD-10-CM

## 2013-12-04 LAB — COMPREHENSIVE METABOLIC PANEL
ALBUMIN: 3.8 g/dL (ref 3.5–5.2)
ALK PHOS: 54 U/L (ref 39–117)
ALT: 8 U/L (ref 0–35)
AST: 14 U/L (ref 0–37)
BILIRUBIN TOTAL: 0.3 mg/dL (ref 0.2–1.2)
BUN: 9 mg/dL (ref 6–23)
CO2: 27 mEq/L (ref 19–32)
Calcium: 9.2 mg/dL (ref 8.4–10.5)
Chloride: 104 mEq/L (ref 96–112)
Creat: 0.69 mg/dL (ref 0.50–1.10)
Glucose, Bld: 81 mg/dL (ref 70–99)
POTASSIUM: 4.6 meq/L (ref 3.5–5.3)
SODIUM: 138 meq/L (ref 135–145)
TOTAL PROTEIN: 6.7 g/dL (ref 6.0–8.3)

## 2013-12-04 LAB — LIPID PANEL
Cholesterol: 223 mg/dL — ABNORMAL HIGH (ref 0–200)
HDL: 50 mg/dL (ref >39)
LDL Cholesterol: 142 mg/dL — ABNORMAL HIGH (ref 0–99)
Total CHOL/HDL Ratio: 4.5 Ratio
Triglycerides: 157 mg/dL — ABNORMAL HIGH (ref <150)
VLDL: 31 mg/dL (ref 0–40)

## 2013-12-04 LAB — HEMOGLOBIN A1C
HEMOGLOBIN A1C: 5.9 % — AB (ref <5.7)
MEAN PLASMA GLUCOSE: 123 mg/dL — AB (ref <117)

## 2013-12-05 LAB — TSH: TSH: 0.536 u[IU]/mL (ref 0.350–4.500)

## 2013-12-12 ENCOUNTER — Telehealth: Payer: Self-pay | Admitting: *Deleted

## 2013-12-12 NOTE — Telephone Encounter (Signed)
Message copied by Graylon Good on Wed Dec 12, 2013  9:18 AM ------      Message from: GRUBB, PATRICIA R      Created: Wed Dec 05, 2013  8:20 PM       Let patient know her lab results.  Total cholesterol. Triglycerides, and LDL are elevated.  HGB AIC is elevated - she has strong [place] for DM and needs to be on a low cholesterol and low carb diet.  See if she wants to have diet consult with [facility].  TSH and CMP is normal. ------

## 2013-12-12 NOTE — Telephone Encounter (Signed)
Pt notified in result note.  

## 2013-12-12 NOTE — Telephone Encounter (Signed)
I have attempted to contact this patient by phone with the following results: left message to return my call on answering machine (home/mobile per DPR).  

## 2013-12-20 ENCOUNTER — Ambulatory Visit (INDEPENDENT_AMBULATORY_CARE_PROVIDER_SITE_OTHER): Payer: BC Managed Care – PPO

## 2013-12-20 ENCOUNTER — Other Ambulatory Visit: Payer: Self-pay | Admitting: Obstetrics & Gynecology

## 2013-12-20 ENCOUNTER — Encounter: Payer: Self-pay | Admitting: Obstetrics & Gynecology

## 2013-12-20 ENCOUNTER — Ambulatory Visit (INDEPENDENT_AMBULATORY_CARE_PROVIDER_SITE_OTHER): Payer: BC Managed Care – PPO | Admitting: Obstetrics & Gynecology

## 2013-12-20 VITALS — BP 104/72 | Ht 67.0 in | Wt 208.0 lb

## 2013-12-20 DIAGNOSIS — N840 Polyp of corpus uteri: Secondary | ICD-10-CM | POA: Insufficient documentation

## 2013-12-20 DIAGNOSIS — N938 Other specified abnormal uterine and vaginal bleeding: Secondary | ICD-10-CM

## 2013-12-20 DIAGNOSIS — N921 Excessive and frequent menstruation with irregular cycle: Secondary | ICD-10-CM

## 2013-12-20 DIAGNOSIS — N949 Unspecified condition associated with female genital organs and menstrual cycle: Secondary | ICD-10-CM

## 2013-12-20 NOTE — Progress Notes (Signed)
43 y.o. G3P2 Single AA female here for a pelvic ultrasound with sonohystogram due to DUB.  Pt has been on Ortho Novum 7/7/7 for years and over the past year is having a lot more mid cycle spotting.  This is not related to intercourse.  Discussed this with Kem Boroughs at [place].  U/S recommended.  Indication: DUB, mid cycle spotting   Patient's last menstrual period was 11/13/2013.  Contraception: OCPs  Technique:  Both transabdominal and transvaginal ultrasound examinations of the pelvis were performed. Transabdominal technique was performed for global imaging of the pelvis including uterus, ovaries, adnexal regions, and pelvic cul-de-sac.  It was necessary to proceed with endovaginal exam following the abdominal ultrasound transabdominal exam to visualize the endometrium and adnexa.  Color and duplex Doppler ultrasound was utilized to evaluate blood flow to the ovaries.   FINDINGS: UTERUS: 9.3 x 6.0 x 5.2cm with several small fibroids 0.7cm, 1.1cm, 2.2cm, and 1.8cm EMS: 6.16mm ADNEXA: Left ovary: 4.0 x 1.9 x 1.9cm Right ovary: 2.9 x 2.7 x 2.4cm CUL DE SAC:  SHSG:  After obtaining appropriate verbal consent from patient, the cervix was visualized using a speculum, and prepped with betadine.  A tenaculum  was not applied to the cervix.  Dilation of the cervix was not necessary. The catheter was passed into the uterus and sterile saline introduced, with the following findings: small 69mm probable endometrial polyp noted.  No encroachment of the fibroids into the uterine cavity noted.  Assessment:  DUB probably from 27mm endometrial polyp  Plan:  Due to size, removal is possible but likelihood of abnormal pathology is quite low.  Pt knows without pathology, that I cannot be 100% sure but I feel this is totally fine to watch until the DUB is really bothersome to her.  Procedure for removal discussed with pt.  She is really not interested in surgery as this time.  She will continue to monitor and knows  to call if she has any worsening symptoms.  All questions answered.    ~25 minutes spent with patient >50% of time was in face to face discussion of above.

## 2014-01-21 ENCOUNTER — Telehealth: Payer: Self-pay | Admitting: Nurse Practitioner

## 2014-01-21 NOTE — Telephone Encounter (Signed)
Patient has a question about her birth control pills. She takes 70 but is not sure that is what she currently has. She says "it doesn't look the same."

## 2014-01-22 MED ORDER — NORETHIN-ETH ESTRAD TRIPHASIC 0.5/0.75/1-35 MG-MCG PO TABS: 1.0000 | ORAL_TABLET | Freq: Every day | ORAL | Status: DC

## 2014-01-22 NOTE — Telephone Encounter (Signed)
Call in local RX to pharmacy and start on this new pack ASAP

## 2014-01-22 NOTE — Telephone Encounter (Signed)
Called patient to ensure she had talked with pharmacist from [facility].  She states that they did send her [place] and she has been taking the tablets. Patient wants to know if it is okay to take these tablets until she receives her new tablets in the mail. She has not missed any pills.

## 2014-01-22 NOTE — Telephone Encounter (Signed)
Spoke with patient. Patient states that she has been taking ortho novum 777 for a long time and when she got her prescription from [facility] for her birth control the pack does not look the same. "At first I thought it was because it was a different brand but then I started looking at the pills and they are all different. Patient does not have pill pack available to tell me the name of the pills. Advised patient would check with provider and [facility] regarding medication ordered and give patient a call back with further information. Patient agreeable.

## 2014-01-22 NOTE — Telephone Encounter (Signed)
Spoke with patient and advised that new pack of pills ortho novum 7/7/7 was sent to [facility] on [address], confirmed zero dollar copay and to start pack with next pill equivalent in pack. So take Wednesday pill of new pack tomorrow. She verbalizes understanding of plan. She will pick up new pack. She will call with any concerns. Advised since no break in contraception will not need back up method, but if has concerns regarding pregnancy can use back up method. She is agreeable to this.   Routing to provider for final review. Patient agreeable to disposition. Will close encounter

## 2014-01-22 NOTE — Telephone Encounter (Signed)
Incoming call from [employer] and states that she believes that Myzilra was dispensed as equivalent to triphasil. She states she left a message to discuss with patient as well.

## 2014-01-22 NOTE — Telephone Encounter (Signed)
Left message to call Kaitlyn at [phone]. 

## 2014-01-22 NOTE — Telephone Encounter (Signed)
[facility] to see what rx patient was dispensed. Spoke with Air cabin crew. She advises patient was dispensed Myzilra. Our order is as follows: norethindrone-ethinyl estradiol (TRIPHASIL,CYCLAFEM,ALYACEN) 0.5/0.75/1-35 MG-MCG tablet.   She agrees that this would not be an equivalent to Ortho Novum 7/7/7, she states [facility]. She states she will call patient and would request return of the [place] and then will replace rx with Cyclafem 777. She states she will call patient to discuss.

## 2014-01-23 ENCOUNTER — Other Ambulatory Visit: Payer: Self-pay | Admitting: Emergency Medicine

## 2014-05-20 ENCOUNTER — Other Ambulatory Visit: Payer: Self-pay | Admitting: Obstetrics & Gynecology

## 2014-05-20 ENCOUNTER — Telehealth: Payer: Self-pay | Admitting: Nurse Practitioner

## 2014-05-20 DIAGNOSIS — N644 Mastodynia: Secondary | ICD-10-CM

## 2014-05-20 NOTE — Telephone Encounter (Signed)
Patient is having a breast issue and is asking for an order to have a MMG.

## 2014-05-20 NOTE — Telephone Encounter (Signed)
Spoke with patient. Patient states that she is due for her mammogram in November. Over the weekend patient's right breast began to become sore and painful. Patient was advised to call us to see if she could be seen for mmg sooner. Advised patient will need to come in to see Sarah Cage, FNP for evaluation and then proper orders will be placed. Patient is agreeable. Appointment scheduled for tomorrow at 11:15am with Sarah Keller, [place]. Patient agreeable to date and time.  Routing to provider for final review. Patient agreeable to disposition. Will close encounter

## 2014-05-21 ENCOUNTER — Ambulatory Visit (INDEPENDENT_AMBULATORY_CARE_PROVIDER_SITE_OTHER): Payer: BC Managed Care – PPO | Admitting: Nurse Practitioner

## 2014-05-21 ENCOUNTER — Other Ambulatory Visit: Payer: Self-pay

## 2014-05-21 ENCOUNTER — Other Ambulatory Visit: Payer: Self-pay | Admitting: Obstetrics & Gynecology

## 2014-05-21 ENCOUNTER — Encounter: Payer: Self-pay | Admitting: Nurse Practitioner

## 2014-05-21 VITALS — BP 110/64 | HR 76 | Ht 67.0 in | Wt 213.0 lb

## 2014-05-21 DIAGNOSIS — N644 Mastodynia: Secondary | ICD-10-CM

## 2014-05-21 NOTE — Progress Notes (Signed)
   Subjective:   43 y.o. Single African American G2, P2. female presents for evaluation of right breast pain. Onset of the symptoms was 2 days ago. Patient sought evaluation because of breast tenderness.  Contributing factors include none. Denies anorexia, chills, fatigue, fevers, malaise, night sweats and weight loss. Patient denies history of trauma, bites, or injuries. Last mammogram was June 19, 2013.  Previous evaluation has included: none - she called for Mammo and was instructed to come here first.  No history of injury, bites, scratches.  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  No excessive lifting at work.  Did not breast feed.  LMP 04/30/14. OCP for contraception. These symptoms started after mid cycle.   Review of Systems Pertinent items are noted in HPI.   Objective:   General appearance: alert, cooperative and appears stated age Head: Normocephalic, without obvious abnormality, atraumatic Neck: no adenopathy, supple, symmetrical, trachea midline and thyroid not enlarged, symmetric, no tenderness/mass/nodules Back: negative, symmetric, no curvature. ROM normal. No CVA tenderness. Lungs: clear to auscultation bilaterally Breasts: normal appearance, no masses or tenderness, on the left, on the right at 10- 11:00 position there is tendrness without a mass at 2 fingerbreaths from the areola.  No lymph nodes, no nipple discharge.   No trauma, redness, lesions.  Heart: regular rate and rhythm Abdomen: soft, non-tender; bowel sounds normal; no masses,  no organomegaly    Assessment:   ASSESSMENT:Patient is diagnosed with mastalgia, likely due to fibrocystic changes   Plan:   PLAN: The patient has a documented plan to follow with further care for a diagnostic Mammo and [place] on 06/05/14   2. PLAN: FOLLOW as needed

## 2014-05-21 NOTE — Progress Notes (Signed)
Patient is scheduled for Bilateral Breast Diagnostic Mammogram and R Breast Ultrasound at [facility] imaging on 06/05/14 at 0900 . Patient agreeable to time/date/location.

## 2014-05-21 NOTE — Patient Instructions (Signed)
Will get diagnostic Mammo done on 10/21 and follow

## 2014-05-26 NOTE — Progress Notes (Signed)
Encounter reviewed by Dr. Brook Silva.  

## 2014-06-05 ENCOUNTER — Ambulatory Visit
Admission: RE | Admit: 2014-06-05 | Discharge: 2014-06-05 | Disposition: A | Discharge status: Home / Self Care | Payer: BC Managed Care – PPO | Source: Ambulatory Visit | Attending: Obstetrics & Gynecology | Admitting: Obstetrics & Gynecology

## 2014-06-05 ENCOUNTER — Ambulatory Visit
Admission: RE | Admit: 2014-06-05 | Discharge: 2014-06-05 | Disposition: A | Discharge status: Home / Self Care | Payer: BC Managed Care – PPO | Source: Ambulatory Visit | Attending: Nurse Practitioner | Admitting: Nurse Practitioner

## 2014-06-05 DIAGNOSIS — N644 Mastodynia: Secondary | ICD-10-CM

## 2014-06-13 ENCOUNTER — Telehealth: Payer: Self-pay | Admitting: *Deleted

## 2014-06-13 NOTE — Telephone Encounter (Signed)
Message copied by Jaymes Graff on Thu Jun 13, 2014 12:19 PM ------      Message from: Kem Boroughs R      Created: Thu Jun 06, 2014  8:34 AM       Please schedule patient to return in a month for breast recheck since she was having pain. ------

## 2014-06-13 NOTE — Telephone Encounter (Signed)
Call to patient, [facility], ask for triage.

## 2014-06-17 ENCOUNTER — Encounter: Payer: Self-pay | Admitting: Nurse Practitioner

## 2014-06-26 NOTE — Telephone Encounter (Signed)
Follow-up call to patient to advised of Dr Ammie Ferrier recommendation for breast recheck with Patty in one month due to breast pain. (see MMG result note)  Patient states all breast pain has resolved and she isnt sure she needs to come back in. Advised that this is MD recommendation after review of MMG, even though radiologically benign, still recommend close follow-up. Patient can decline, just need her to know our recommendation. Patient states she will decline for now but will call back for OV if pain returns. Stressed to call back if changes her mind or pain returns and we would be happy to see her.  Routing to [place] and Dr Sabra Heck for review since patient declined to schedule breast recheck. Any further follow-up?

## 2014-06-26 NOTE — Telephone Encounter (Signed)
No other recommendations.  She is in medical field an also very responsible.

## 2014-06-27 NOTE — Telephone Encounter (Signed)
Encounter closed

## 2014-07-24 ENCOUNTER — Telehealth: Payer: Self-pay | Admitting: Emergency Medicine

## 2014-07-24 NOTE — Telephone Encounter (Signed)
-----   Message from [place], MD sent at 07/17/2014  8:32 AM EST ----- Regarding: RE: Mammogram hold  Ok to remove from mammogram hold.   ----- Message -----    From: Karen Chafe, RN    Sent: 07/16/2014   2:00 PM      To: Brook E Amundson de Berton Lan, MD Subject: Mammogram hold                                 Dr. Quincy Simmonds, can you review images and advise if okay to remove from mammogram hold?

## 2014-07-24 NOTE — Telephone Encounter (Signed)
Out of mammogram hold.   

## 2014-07-29 ENCOUNTER — Telehealth: Payer: Self-pay | Admitting: Nurse Practitioner

## 2014-07-29 NOTE — Telephone Encounter (Signed)
Pt is having a problem and would like to see Sarah Keller.

## 2014-07-29 NOTE — Telephone Encounter (Signed)
Spoke with patient. Patient states that she thinks she has a bacterial infection. "I have odor and discharge. I have had these before and it seems the same." Symptoms began on Saturday. Patient would like to come in tomorrow morning for appointment with [name], FNP. Appointment scheduled for tomorrow morning at 8:30am with [name], [place]. Patient is agreeable to date and time.  Routing to provider for final review. Patient agreeable to disposition. Will close encounter

## 2014-07-30 ENCOUNTER — Encounter: Payer: Self-pay | Admitting: Nurse Practitioner

## 2014-07-30 ENCOUNTER — Ambulatory Visit (INDEPENDENT_AMBULATORY_CARE_PROVIDER_SITE_OTHER): Payer: BC Managed Care – PPO | Admitting: Nurse Practitioner

## 2014-07-30 VITALS — BP 120/74 | HR 84 | Ht 67.0 in | Wt 216.0 lb

## 2014-07-30 DIAGNOSIS — B9689 Other specified bacterial agents as the cause of diseases classified elsewhere: Secondary | ICD-10-CM

## 2014-07-30 DIAGNOSIS — N760 Acute vaginitis: Secondary | ICD-10-CM

## 2014-07-30 DIAGNOSIS — A499 Bacterial infection, unspecified: Secondary | ICD-10-CM

## 2014-07-30 MED ORDER — METRONIDAZOLE 500 MG PO TABS: 500.0000 mg | ORAL_TABLET | Freq: Two times a day (BID) | ORAL | Status: DC

## 2014-07-30 NOTE — Patient Instructions (Signed)
Bacterial Vaginosis Bacterial vaginosis is a vaginal infection that occurs when the normal balance of bacteria in the vagina is disrupted. It results from an overgrowth of certain bacteria. This is the most common vaginal infection in women of childbearing age. Treatment is important to prevent complications, especially in pregnant women, as it can cause a premature delivery. CAUSES  Bacterial vaginosis is caused by an increase in harmful bacteria that are normally present in smaller amounts in the vagina. Several different kinds of bacteria can cause bacterial vaginosis. However, the reason that the condition develops is not fully understood. RISK FACTORS Certain activities or behaviors can put you at an increased risk of developing bacterial vaginosis, including:  Having a new sex partner or multiple sex partners.  Douching.  Using an intrauterine device (IUD) for contraception. Women do not get bacterial vaginosis from toilet seats, bedding, swimming pools, or contact with objects around them. SIGNS AND SYMPTOMS  Some women with bacterial vaginosis have no signs or symptoms. Common symptoms include:  Grey vaginal discharge.  A fishlike odor with discharge, especially after sexual intercourse.  Itching or burning of the vagina and vulva.  Burning or pain with urination. DIAGNOSIS  Your health care provider will take a medical history and examine the vagina for signs of bacterial vaginosis. A sample of vaginal fluid may be taken. Your health care provider will look at this sample under a microscope to check for bacteria and abnormal cells. A vaginal pH test may also be done.  TREATMENT  Bacterial vaginosis may be treated with antibiotic medicines. These may be given in the form of a pill or a vaginal cream. A second round of antibiotics may be prescribed if the condition comes back after treatment.  HOME CARE INSTRUCTIONS   Only take over-the-counter or prescription medicines as  directed by your health care provider.  If antibiotic medicine was prescribed, take it as directed. Make sure you finish it even if you start to feel better.  Do not have sex until treatment is completed.  Tell all sexual partners that you have a vaginal infection. They should see their health care provider and be treated if they have problems, such as a mild rash or itching.  Practice safe sex by using condoms and only having one sex partner. SEEK MEDICAL CARE IF:   Your symptoms are not improving after 3 days of treatment.  You have increased discharge or pain.  You have a fever. MAKE SURE YOU:   Understand these instructions.  Will watch your condition.  Will get help right away if you are not doing well or get worse. FOR MORE INFORMATION  Centers for Disease Control and Prevention, Division of STD Prevention: www.cdc.gov/std American Sexual Health Association (ASHA): www.ashastd.org  Document Released: 08/02/2005 Document Revised: 05/23/2013 Document Reviewed: 03/14/2013 ExitCare Patient Information 2015 [employer]. This information is not intended to replace advice given to you by your health care provider. Make sure you discuss any questions you have with your health care provider.  

## 2014-07-30 NOTE — Progress Notes (Signed)
44 y.o.SAA Fe G2, P2 here with complaint of vaginal symptoms of odor, burning, and increase discharge. Describes discharge as white to clear. Onset of symptoms 4 days ago. Denies new personal products or vaginal dryness.  No STD concerns.  Same partner with no symptoms. Urinary symptoms none.  She also complains of a lesion left forearm that is raised and itching.  Occurred over the weekend.  Does not recall a bite.  It does itch. Some redness around it yesterday and getting larger today.   O: Healthy female WDWN Affect: normal, orientation x 3  Exam: new lesion left forearm that is raised and pustule.  Measures 1 cm with slight induration. Abdomen: soft and non tender Lymph node: no enlargement or tenderness Pelvic exam: External genital: normal BUS: negative Vagina: yellow  discharge noted. Affirm test taken Cervix: normal, non tender Adnexa:normal, non tender, no masses or fullness noted, no tenderness over bladder.  Affirm results: pending   A: History of recurrent BV   Lesion left forearm - ? Spider bite - will seek care at PCP   P: Discussed findings of discharge and etiology. Discussed Aveeno or baking soda sitz bath for comfort. Avoid moist clothes or pads for extended period of time.   Rx: went ahead and treated for BV pending results with her chronic history of same.  Flagyl is given and knows GI and ETOH side effects.  RV prn

## 2014-07-31 LAB — WET PREP BY MOLECULAR PROBE
Candida species: NEGATIVE
Gardnerella vaginalis: POSITIVE — AB
TRICHOMONAS VAG: NEGATIVE

## 2014-08-01 ENCOUNTER — Other Ambulatory Visit: Payer: Self-pay | Admitting: Nurse Practitioner

## 2014-08-01 MED ORDER — FLUCONAZOLE 150 MG PO TABS: 150.0000 mg | ORAL_TABLET | Freq: Once | ORAL | Status: DC

## 2014-08-04 NOTE — Progress Notes (Signed)
Encounter reviewed by Dr. [place] Bowlds Silva.  

## 2014-12-03 ENCOUNTER — Ambulatory Visit (INDEPENDENT_AMBULATORY_CARE_PROVIDER_SITE_OTHER): Payer: BLUE CROSS/BLUE SHIELD | Admitting: Nurse Practitioner

## 2014-12-03 ENCOUNTER — Encounter: Payer: Self-pay | Admitting: Nurse Practitioner

## 2014-12-03 VITALS — BP 108/72 | HR 72 | Ht 67.25 in | Wt 216.0 lb

## 2014-12-03 DIAGNOSIS — Z01419 Encounter for gynecological examination (general) (routine) without abnormal findings: Secondary | ICD-10-CM | POA: Diagnosis not present

## 2014-12-03 DIAGNOSIS — Z0000 Encounter for general adult medical examination without abnormal findings: Secondary | ICD-10-CM

## 2014-12-03 DIAGNOSIS — R8290 Unspecified abnormal findings in urine: Secondary | ICD-10-CM

## 2014-12-03 DIAGNOSIS — N760 Acute vaginitis: Secondary | ICD-10-CM

## 2014-12-03 DIAGNOSIS — Z113 Encounter for screening for infections with a predominantly sexual mode of transmission: Secondary | ICD-10-CM

## 2014-12-03 LAB — COMPREHENSIVE METABOLIC PANEL
ALT: 12 U/L (ref 0–35)
AST: 17 U/L (ref 0–37)
Albumin: 3.8 g/dL (ref 3.5–5.2)
Alkaline Phosphatase: 45 U/L (ref 39–117)
BUN: 9 mg/dL (ref 6–23)
CALCIUM: 8.6 mg/dL (ref 8.4–10.5)
CHLORIDE: 106 meq/L (ref 96–112)
CO2: 20 meq/L (ref 19–32)
CREATININE: 0.72 mg/dL (ref 0.50–1.10)
Glucose, Bld: 88 mg/dL (ref 70–99)
POTASSIUM: 4.3 meq/L (ref 3.5–5.3)
Sodium: 140 mEq/L (ref 135–145)
Total Bilirubin: 0.3 mg/dL (ref 0.2–1.2)
Total Protein: 6.7 g/dL (ref 6.0–8.3)

## 2014-12-03 LAB — CBC WITH DIFFERENTIAL/PLATELET
Basophils Absolute: 0 10*3/uL (ref 0.0–0.1)
Basophils Relative: 0 % (ref 0–1)
Eosinophils Absolute: 0.2 10*3/uL (ref 0.0–0.7)
Eosinophils Relative: 3 % (ref 0–5)
HCT: 38.2 % (ref 36.0–46.0)
HEMOGLOBIN: 12.3 g/dL (ref 12.0–15.0)
LYMPHS ABS: 2 10*3/uL (ref 0.7–4.0)
Lymphocytes Relative: 39 % (ref 12–46)
MCH: 27.8 pg (ref 26.0–34.0)
MCHC: 32.2 g/dL (ref 30.0–36.0)
MCV: 86.4 fL (ref 78.0–100.0)
MONOS PCT: 6 % (ref 3–12)
MPV: 9.8 fL (ref 8.6–12.4)
Monocytes Absolute: 0.3 10*3/uL (ref 0.1–1.0)
NEUTROS ABS: 2.7 10*3/uL (ref 1.7–7.7)
Neutrophils Relative %: 52 % (ref 43–77)
Platelets: 330 10*3/uL (ref 150–400)
RBC: 4.42 MIL/uL (ref 3.87–5.11)
RDW: 13.3 % (ref 11.5–15.5)
WBC: 5.2 10*3/uL (ref 4.0–10.5)

## 2014-12-03 LAB — LIPID PANEL
Cholesterol: 226 mg/dL — ABNORMAL HIGH (ref 0–200)
HDL: 53 mg/dL (ref >=46)
LDL Cholesterol: 143 mg/dL — ABNORMAL HIGH (ref 0–99)
Total CHOL/HDL Ratio: 4.3 Ratio
Triglycerides: 149 mg/dL (ref <150)
VLDL: 30 mg/dL (ref 0–40)

## 2014-12-03 LAB — TSH: TSH: 0.882 u[IU]/mL (ref 0.350–4.500)

## 2014-12-03 LAB — POCT URINALYSIS DIPSTICK
Bilirubin, UA: NEGATIVE
Glucose, UA: NEGATIVE
Nitrite, UA: NEGATIVE
PH UA: 5
Urobilinogen, UA: NEGATIVE

## 2014-12-03 MED ORDER — NORETHIN-ETH ESTRAD TRIPHASIC 0.5/0.75/1-35 MG-MCG PO TABS: 1.0000 | ORAL_TABLET | Freq: Every day | ORAL | Status: DC

## 2014-12-03 NOTE — Progress Notes (Signed)
Patient ID: Sarah Keller, female   DOB: March 01, 1971, 44 y.o.   MRN: [id number] 44 y.o. G3P0010 Single  African American Fe here for annual exam. Since PUS/ SHGM 12/20/13 she still has been having BTB that is not related to menses or SA.  Seems to be more mid to late cycle.  Lasting 3-4 days.  PUS shows 4 small fibroids and 1 polyp, conservative treatment.  Now considering removal of polyp.  Will call when needs the consult apt. with Dr. Sabra Heck.  Currently fasting and on low carb diet.  Patient's last menstrual period was 11/12/2014.          Sexually active: Yes.    The current method of family planning is OCP (estrogen/progesterone).    Exercising: No.  The patient does not participate in regular exercise at present. Smoker:  no  Health Maintenance: Pap:  11/23/11, negative with neg HR HPV MMG:  06/05/14, Bi-Rads 1:  Negative TDaP:  07/24/07 Labs:  HB:  12.1  Urine:  Trace leuk's, trace protein, 3+ ketones, large RBC (reducing carb's and menstrual spotting)   reports that she has never smoked. She has never used smokeless tobacco. She reports that she does not drink alcohol or use illicit drugs.  Past Medical History  Diagnosis Date  . Abnormal pap   . HSV infection     Asymptomatic    Past Surgical History  Procedure Laterality Date  . Cesarean section  04/24/2003  . Wisdom tooth extraction  early 20's    Current Outpatient Prescriptions  Medication Sig Dispense Refill  . cholecalciferol (VITAMIN D) 1000 UNITS tablet Take 1,000 Units by mouth daily.    . norethindrone-ethinyl estradiol (CYCLAFEM,ALYACEN) 0.5/0.75/1-35 MG-MCG tablet Take 1 tablet by mouth daily. 3 Package 3   No current facility-administered medications for this visit.    Family History  Problem Relation Age of Onset  . Diabetes Mother   . Hypertension Mother   . Heart disease Mother   . COPD Mother   . Diabetes Father   . Diabetes Maternal Grandmother   . Hypertension Maternal Grandmother   . Heart disease  Maternal Grandmother   . Heart failure Maternal Grandfather   . Heart disease Maternal Grandfather     ROS:  Pertinent items are noted in HPI.  Otherwise, a comprehensive ROS was negative.  Exam:   BP 108/72 mmHg  Pulse 72  Ht 5' 7.25" (1.708 m)  Wt 216 lb (97.977 kg)  BMI 33.59 kg/m2  LMP 11/12/2014 Height: 5' 7.25" (170.8 cm) Ht Readings from Last 3 Encounters:  12/03/14 5' 7.25" (1.708 m)  07/30/14 5\' 7"  (1.702 m)  05/21/14 5\' 7"  (1.702 m)    General appearance: alert, cooperative and appears stated age Head: Normocephalic, without obvious abnormality, atraumatic Neck: no adenopathy, supple, symmetrical, trachea midline and thyroid normal to inspection and palpation Lungs: clear to auscultation bilaterally Breasts: normal appearance, no masses or tenderness Heart: regular rate and rhythm Abdomen: soft, non-tender; no masses,  no organomegaly Extremities: extremities normal, atraumatic, no cyanosis or edema Skin: Skin color, texture, turgor normal. No rashes or lesions Lymph nodes: Cervical, supraclavicular, and axillary nodes normal. No abnormal inguinal nodes palpated Neurologic: Grossly normal   Pelvic: External genitalia:  no lesions              Urethra:  normal appearing urethra with no masses, tenderness or lesions              Bartholin's and Skene's: normal  Vagina: normal appearing vagina with normal color and thin yellow discharge, no lesions              Cervix: anteverted              Pap taken: Yes.   Bimanual Exam:  Uterus:  normal size, contour, position, consistency, mobility, non-tender              Adnexa: no mass, fullness, tenderness               Rectovaginal: Confirms               Anus:  normal sphincter tone, no lesions  Chaperone present: yes  A:  Well Woman with normal exam  OCP for Contraception Intermenstrual bleeding for about a year - not usually related to SA; with PUS SHGM on 12/20/2013 showing an endo polyp  and 4 small fibroids - considering removal of polyp to stop BTB. R/O vaginitis and UTI History of Vit D deficiency  R/O STD's   P:   Reviewed health and wellness pertinent to exam  Pap smear taken today  Mammogram is due 05/2015  Refill on ON 777 for a year  Follow with labs  Aware that she will need consult visit to proceed with endo polyp removal  Counseled on breast self exam, mammography screening, adequate intake of calcium and vitamin D, diet and exercise return annually or prn  An After Visit Summary was printed and given to the patient.

## 2014-12-03 NOTE — Patient Instructions (Signed)

## 2014-12-04 ENCOUNTER — Other Ambulatory Visit: Payer: Self-pay | Admitting: Certified Nurse Midwife

## 2014-12-04 ENCOUNTER — Telehealth: Payer: Self-pay | Admitting: Nurse Practitioner

## 2014-12-04 DIAGNOSIS — N760 Acute vaginitis: Principal | ICD-10-CM

## 2014-12-04 DIAGNOSIS — B9689 Other specified bacterial agents as the cause of diseases classified elsewhere: Secondary | ICD-10-CM

## 2014-12-04 LAB — URINALYSIS, MICROSCOPIC ONLY
BACTERIA UA: NONE SEEN
Casts: NONE SEEN
Crystals: NONE SEEN
Squamous Epithelial / LPF: NONE SEEN

## 2014-12-04 LAB — STD PANEL
HIV 1&2 Ab, 4th Generation: NONREACTIVE
Hepatitis B Surface Ag: NEGATIVE

## 2014-12-04 LAB — WET PREP BY MOLECULAR PROBE
Candida species: NEGATIVE
Gardnerella vaginalis: POSITIVE — AB
Trichomonas vaginosis: NEGATIVE

## 2014-12-04 LAB — VITAMIN D 25 HYDROXY (VIT D DEFICIENCY, FRACTURES): Vit D, 25-Hydroxy: 28 ng/mL — ABNORMAL LOW (ref 30–100)

## 2014-12-04 MED ORDER — CLINDAMYCIN PHOSPHATE 2 % VA CREA: TOPICAL_CREAM | VAGINAL | Status: DC

## 2014-12-04 NOTE — Telephone Encounter (Signed)
Pt notified in result note.  Closing encounter. 

## 2014-12-04 NOTE — Progress Notes (Signed)
Reviewed personally.  M. Suzanne Geraldyne Barraclough, MD.  

## 2014-12-04 NOTE — Telephone Encounter (Signed)
Pt returning call

## 2014-12-05 LAB — IPS N GONORRHOEA AND CHLAMYDIA BY PCR

## 2014-12-05 LAB — IPS PAP TEST WITH HPV

## 2014-12-05 LAB — URINE CULTURE
COLONY COUNT: NO GROWTH
Organism ID, Bacteria: NO GROWTH

## 2014-12-05 LAB — HEMOGLOBIN, FINGERSTICK: HEMOGLOBIN, FINGERSTICK: 12.1 g/dL (ref 12.0–16.0)

## 2014-12-13 ENCOUNTER — Other Ambulatory Visit: Payer: Self-pay | Admitting: Nurse Practitioner

## 2014-12-13 NOTE — Telephone Encounter (Signed)
Medication refill request: Cyclafem  Last AEX:  12/03/14 PG  Next AEX: 12/05/15 PG  Last MMG (if hormonal medication request): 06/05/14 [id number]:Neg Refill authorized: 12/03/14 #3packs/ 3 R. To [place] from [facility].  Rx sent to [facility] as prescribed 12/03/14 by PG. Routed to [facility] for review.  Encounter closed.

## 2015-02-21 ENCOUNTER — Telehealth: Payer: Self-pay | Admitting: Nurse Practitioner

## 2015-02-21 NOTE — Telephone Encounter (Signed)
Patient canceled her upcoming vit d reck appointment 02/26/15. Patient to call later to rs.

## 2015-02-24 NOTE — Telephone Encounter (Signed)
No further follow up.

## 2015-02-24 NOTE — Telephone Encounter (Signed)
Please advise any further follow up.

## 2015-02-26 ENCOUNTER — Other Ambulatory Visit: Payer: Self-pay

## 2015-05-16 ENCOUNTER — Other Ambulatory Visit: Payer: Self-pay

## 2015-05-16 DIAGNOSIS — Z1231 Encounter for screening mammogram for malignant neoplasm of breast: Secondary | ICD-10-CM

## 2015-06-09 ENCOUNTER — Ambulatory Visit
Admission: RE | Admit: 2015-06-09 | Discharge: 2015-06-09 | Disposition: A | Discharge status: Home / Self Care | Payer: BLUE CROSS/BLUE SHIELD | Source: Ambulatory Visit

## 2015-06-09 DIAGNOSIS — Z1231 Encounter for screening mammogram for malignant neoplasm of breast: Secondary | ICD-10-CM

## 2015-10-28 ENCOUNTER — Ambulatory Visit (INDEPENDENT_AMBULATORY_CARE_PROVIDER_SITE_OTHER): Payer: BLUE CROSS/BLUE SHIELD | Admitting: Nurse Practitioner

## 2015-10-28 ENCOUNTER — Encounter: Payer: Self-pay | Admitting: Nurse Practitioner

## 2015-10-28 VITALS — BP 120/64 | HR 68 | Ht 67.25 in | Wt 220.0 lb

## 2015-10-28 DIAGNOSIS — N760 Acute vaginitis: Secondary | ICD-10-CM | POA: Diagnosis not present

## 2015-10-28 MED ORDER — METRONIDAZOLE 500 MG PO TABS: 500.0000 mg | ORAL_TABLET | Freq: Two times a day (BID) | ORAL | Status: DC

## 2015-10-28 MED ORDER — FLUCONAZOLE 150 MG PO TABS: 150.0000 mg | ORAL_TABLET | Freq: Once | ORAL | Status: DC

## 2015-10-28 NOTE — Patient Instructions (Signed)

## 2015-10-28 NOTE — Progress Notes (Signed)
Encounter reviewed by Dr. Brook Amundson C. Silva.  

## 2015-10-28 NOTE — Progress Notes (Signed)
45 y.o. Single African American female G3P0010 here with complaint of vaginal symptoms of  odor and burning, with some increase discharge. Describes discharge is minimal.  She has history of BV that is aggravated with menses at times. Onset of symptoms 4 days ago. Denies new personal products or vaginal dryness.  No STD concerns. Urinary symptoms none . Contraception is OCP.   O:  Healthy female WDWN Affect: normal, orientation x 3 Exam: no distress Abdomen: soft and non tender Lymph node: no enlargement or tenderness Pelvic exam: External genital: normal female BUS: negative Vagina: thin pink tinged discharge noted.  Affirm taken.    A: Vaginitis most likely BV with history of same   P: Discussed findings of vaginitis and etiology. Discussed Aveeno or baking soda sitz bath for comfort. Avoid moist clothes or pads for extended period of time. If working out in gym clothes or swim suits for long periods of time change underwear or bottoms of swimsuit if possible. Olive Oil/Coconut Oil use for skin protection prior to activity can be used to external skin.  Rx: Flagyl 500 mg BID # 14  ( Cleocin vaginal works well but is expensive)  Diflucan 150 mg X 2 if needed  Follow with Affirm  RV prn

## 2015-10-29 LAB — WET PREP BY MOLECULAR PROBE
CANDIDA SPECIES: NEGATIVE
Gardnerella vaginalis: POSITIVE — AB
TRICHOMONAS VAG: NEGATIVE

## 2015-11-30 ENCOUNTER — Other Ambulatory Visit: Payer: Self-pay | Admitting: Nurse Practitioner

## 2015-12-01 NOTE — Telephone Encounter (Signed)
Medication refill request: OCP Last AEX:  12-03-14 Next AEX: 12-05-15 Last MMG (if hormonal medication request): 06-10-15 WNL  Refill authorized: please advise

## 2015-12-05 ENCOUNTER — Ambulatory Visit (INDEPENDENT_AMBULATORY_CARE_PROVIDER_SITE_OTHER): Payer: BLUE CROSS/BLUE SHIELD | Admitting: Nurse Practitioner

## 2015-12-05 ENCOUNTER — Telehealth: Payer: Self-pay | Admitting: Emergency Medicine

## 2015-12-05 ENCOUNTER — Encounter: Payer: Self-pay | Admitting: Nurse Practitioner

## 2015-12-05 VITALS — BP 120/72 | HR 84 | Ht 67.0 in | Wt 213.0 lb

## 2015-12-05 DIAGNOSIS — N760 Acute vaginitis: Secondary | ICD-10-CM

## 2015-12-05 DIAGNOSIS — R8290 Unspecified abnormal findings in urine: Secondary | ICD-10-CM

## 2015-12-05 DIAGNOSIS — Z0000 Encounter for general adult medical examination without abnormal findings: Secondary | ICD-10-CM | POA: Diagnosis not present

## 2015-12-05 DIAGNOSIS — Z01419 Encounter for gynecological examination (general) (routine) without abnormal findings: Secondary | ICD-10-CM

## 2015-12-05 LAB — COMPREHENSIVE METABOLIC PANEL
ALK PHOS: 56 U/L (ref 33–115)
ALT: 11 U/L (ref 6–29)
AST: 17 U/L (ref 10–30)
Albumin: 3.9 g/dL (ref 3.6–5.1)
BILIRUBIN TOTAL: 0.3 mg/dL (ref 0.2–1.2)
BUN: 9 mg/dL (ref 7–25)
CO2: 26 mmol/L (ref 20–31)
Calcium: 9.2 mg/dL (ref 8.6–10.2)
Chloride: 104 mmol/L (ref 98–110)
Creat: 0.87 mg/dL (ref 0.50–1.10)
GLUCOSE: 90 mg/dL (ref 65–99)
Potassium: 4.5 mmol/L (ref 3.5–5.3)
Sodium: 139 mmol/L (ref 135–146)
Total Protein: 7 g/dL (ref 6.1–8.1)

## 2015-12-05 LAB — LIPID PANEL
Cholesterol: 222 mg/dL — ABNORMAL HIGH (ref 125–200)
HDL: 47 mg/dL (ref >=46)
LDL CALC: 146 mg/dL — AB (ref <130)
Total CHOL/HDL Ratio: 4.7 Ratio (ref <=5.0)
Triglycerides: 146 mg/dL (ref <150)
VLDL: 29 mg/dL (ref <30)

## 2015-12-05 LAB — CBC
HCT: 39.5 % (ref 35.0–45.0)
HEMOGLOBIN: 12.7 g/dL (ref 11.7–15.5)
MCH: 27.9 pg (ref 27.0–33.0)
MCHC: 32.2 g/dL (ref 32.0–36.0)
MCV: 86.6 fL (ref 80.0–100.0)
MPV: 10.1 fL (ref 7.5–12.5)
PLATELETS: 315 10*3/uL (ref 140–400)
RBC: 4.56 MIL/uL (ref 3.80–5.10)
RDW: 13.3 % (ref 11.0–15.0)
WBC: 6.4 10*3/uL (ref 3.8–10.8)

## 2015-12-05 LAB — POCT URINALYSIS DIPSTICK
Bilirubin, UA: NEGATIVE
Blood, UA: NEGATIVE
Glucose, UA: NEGATIVE
Ketones, UA: NEGATIVE
Leukocytes, UA: NEGATIVE
Nitrite, UA: NEGATIVE
Protein, UA: NEGATIVE
Urobilinogen, UA: NEGATIVE
pH, UA: 5

## 2015-12-05 LAB — HEMOGLOBIN A1C
HEMOGLOBIN A1C: 5.7 % — AB (ref <5.7)
Mean Plasma Glucose: 117 mg/dL

## 2015-12-05 LAB — TSH: TSH: 0.7 m[IU]/L

## 2015-12-05 MED ORDER — NORETHIN-ETH ESTRAD TRIPHASIC 0.5/0.75/1-35 MG-MCG PO TABS: 1.0000 | ORAL_TABLET | Freq: Every day | ORAL | Status: DC

## 2015-12-05 MED ORDER — METRONIDAZOLE 0.75 % VA GEL: VAGINAL | Status: DC

## 2015-12-05 NOTE — Telephone Encounter (Signed)
Telephone call for triage created to discuss message with patient and disposition as appropriate.   

## 2015-12-05 NOTE — Telephone Encounter (Signed)
Reviewed with Dr. Sabra Heck and Dr. Quincy Simmonds.  Metrogel is only option for suppression at this time. Will advise.

## 2015-12-05 NOTE — Telephone Encounter (Signed)
Patient sent mychart message:   ===View-only below this line===   ----- Message -----    From: Isabella Bowens    Sent: 12/05/2015  4:44 PM EDT      To: Kem Boroughs, FNP Subject: Non-Urgent Medical Question  Patty  prescribed Metrol gel for me to take. Medication cost 275.00. I was wondering if it could be switch to the Clindamycin vaginal gel instead

## 2015-12-05 NOTE — Progress Notes (Signed)
Patient ID: Sarah Keller, female   DOB: [birth year], 45 y.o.   MRN: [id number]  45 y.o. G3P0010 Single  African American Fe here for annual exam. No new health problems.  Menses is regular on OCP.  She does still have BTB that is occasional and not related to SA.  Same partner ~ 14 yrs.  Past PUS showed endo polyp and 4 fibroids, conservative treatment.     Patient's last menstrual period was 11/11/2015 (exact date).          Sexually active: Yes.    The current method of family planning is OCP (estrogen/progesterone).    Exercising: Yes.    cardio Smoker:  no  Health Maintenance: Pap:12/03/14, negative with neg HR HPV MMG:06/09/15, Bi-Rads 1: Negative TDaP: 07/24/07 HIV: 12/03/14 Labs: HB: 12.4  Urine: trace leuk's, small RBC   reports that she has never smoked. She has never used smokeless tobacco. She reports that she does not drink alcohol or use illicit drugs.  Past Medical History  Diagnosis Date  . Abnormal pap about 2004  . HSV infection     Asymptomatic    Past Surgical History  Procedure Laterality Date  . Cesarean section  04/24/2003  . Wisdom tooth extraction  early 20's    Current Outpatient Prescriptions  Medication Sig Dispense Refill  . cholecalciferol (VITAMIN D) 1000 UNITS tablet Take 1,000 Units by mouth daily.    . norethindrone-ethinyl estradiol (PIRMELLA 7/7/7) 0.5/0.75/1-35 MG-MCG tablet Take 1 tablet by mouth daily. 84 tablet 4  . metroNIDAZOLE (METROGEL) 0.75 % vaginal gel Use as directed after menses 70 g 1   No current facility-administered medications for this visit.    Family History  Problem Relation Age of Onset  . Diabetes Mother   . Hypertension Mother   . Heart disease Mother   . COPD Mother   . Diabetes Father   . Diabetes Maternal Grandmother   . Hypertension Maternal Grandmother   . Heart disease Maternal Grandmother   . Heart failure Maternal Grandfather   . Heart disease Maternal Grandfather     ROS:  Pertinent items are  noted in HPI.  Otherwise, a comprehensive ROS was negative.  Exam:   BP 120/72 mmHg  Pulse 84  Ht 5\' 7"  (1.702 m)  Wt 213 lb (96.616 kg)  BMI 33.35 kg/m2  LMP 11/11/2015 (Exact Date) Height: 5\' 7"  (170.2 cm) Ht Readings from Last 3 Encounters:  12/05/15 5\' 7"  (1.702 m)  10/28/15 5' 7.25" (1.708 m)  12/03/14 5' 7.25" (1.708 m)    General appearance: alert, cooperative and appears stated age Head: Normocephalic, without obvious abnormality, atraumatic Neck: no adenopathy, supple, symmetrical, trachea midline and thyroid normal to inspection and palpation Lungs: clear to auscultation bilaterally Breasts: normal appearance, no masses or tenderness Heart: regular rate and rhythm Abdomen: soft, non-tender; no masses,  no organomegaly Extremities: extremities normal, atraumatic, no cyanosis or edema Skin: Skin color, texture, turgor normal. No rashes or lesions Lymph nodes: Cervical, supraclavicular, and axillary nodes normal. No abnormal inguinal nodes palpated Neurologic: Grossly normal   Pelvic: External genitalia:  no lesions              Urethra:  normal appearing urethra with no masses, tenderness or lesions              Bartholin's and Skene's: normal                 Vagina: normal appearing vagina with normal color and pink to  clear discharge, no lesions.  Affirm is taken.              Cervix: anteverted              Pap taken: No. Bimanual Exam:  Uterus:  normal size, contour, position, consistency, mobility, non-tender              Adnexa: no mass, fullness, tenderness               Rectovaginal: Confirms               Anus:  normal sphincter tone, no lesions  Chaperone present: yes  A:  Well Woman with normal exam  OCP for Contraception Intermenstrual bleeding for about a year - not usually related to SA; with PUS SHGM on 12/20/2013 showing an endo polyp and 4 small fibroids - considering removal of polyp to stop BTB.  Discussed today and still does not  desire treatment/ surgery R/O vaginitis and UTI History of Vit D deficiency     P:   Reviewed health and wellness pertinent to exam  Pap smear as above  Mammogram is due 10/17  Refill on OCP for a year  Follow with labs and Affirm  Will try Metrogel to use after menses and SA to reduce BV  Counseled on breast self exam, mammography screening, use and side effects of OCP's, adequate intake of calcium and vitamin D, diet and exercise return annually or prn  An After Visit Summary was printed and given to the patient.

## 2015-12-05 NOTE — Progress Notes (Signed)
Reviewed personally.  M. Suzanne Betzaira Mentel, MD.  

## 2015-12-05 NOTE — Patient Instructions (Signed)

## 2015-12-06 LAB — URINALYSIS, MICROSCOPIC ONLY
BACTERIA UA: NONE SEEN [HPF]
CRYSTALS: NONE SEEN [HPF]
Casts: NONE SEEN [LPF]
WBC, UA: NONE SEEN WBC/HPF (ref <=5)
YEAST: NONE SEEN [HPF]

## 2015-12-06 LAB — VITAMIN D 25 HYDROXY (VIT D DEFICIENCY, FRACTURES): VIT D 25 HYDROXY: 44 ng/mL (ref 30–100)

## 2015-12-06 LAB — WET PREP BY MOLECULAR PROBE
Candida species: NEGATIVE
GARDNERELLA VAGINALIS: POSITIVE — AB
TRICHOMONAS VAG: NEGATIVE

## 2015-12-07 ENCOUNTER — Other Ambulatory Visit: Payer: Self-pay | Admitting: Nurse Practitioner

## 2015-12-07 ENCOUNTER — Encounter: Payer: Self-pay | Admitting: Nurse Practitioner

## 2015-12-07 LAB — URINE CULTURE
Colony Count: NO GROWTH
Organism ID, Bacteria: NO GROWTH

## 2015-12-07 MED ORDER — CLINDAMYCIN PHOSPHATE 2 % VA CREA: 1.0000 | TOPICAL_CREAM | Freq: Every day | VAGINAL | Status: DC

## 2015-12-07 MED ORDER — METRONIDAZOLE 500 MG PO TABS: 500.0000 mg | ORAL_TABLET | Freq: Two times a day (BID) | ORAL | Status: DC

## 2015-12-07 NOTE — Telephone Encounter (Signed)
See PC result note - I did change to Cleocin but not sure if it will be effective for suppression.

## 2015-12-07 NOTE — Telephone Encounter (Signed)
I recommend Metrogel for suppression.    [facility]

## 2015-12-08 LAB — HEMOGLOBIN, FINGERSTICK: HEMOGLOBIN, FINGERSTICK: 12.4 g/dL (ref 12.0–16.0)

## 2015-12-08 NOTE — Telephone Encounter (Signed)
Patient is notified of the fact that Cleocin is not to be used for suppression - only Metrogel is approved.  For the current BV she may either use Flagyl or Cleocin.  She understands and will not use suppression with Cleocin.

## 2016-05-20 ENCOUNTER — Other Ambulatory Visit: Payer: Self-pay | Admitting: Nurse Practitioner

## 2016-05-20 DIAGNOSIS — Z1231 Encounter for screening mammogram for malignant neoplasm of breast: Secondary | ICD-10-CM

## 2016-06-10 ENCOUNTER — Ambulatory Visit
Admission: RE | Admit: 2016-06-10 | Discharge: 2016-06-10 | Disposition: A | Discharge status: Home / Self Care | Payer: BLUE CROSS/BLUE SHIELD | Source: Ambulatory Visit | Attending: Nurse Practitioner | Admitting: Nurse Practitioner

## 2016-06-10 DIAGNOSIS — Z1231 Encounter for screening mammogram for malignant neoplasm of breast: Secondary | ICD-10-CM

## 2016-12-08 ENCOUNTER — Ambulatory Visit: Payer: BLUE CROSS/BLUE SHIELD | Admitting: Nurse Practitioner

## 2016-12-17 ENCOUNTER — Encounter: Payer: Self-pay | Admitting: Nurse Practitioner

## 2016-12-17 ENCOUNTER — Ambulatory Visit (INDEPENDENT_AMBULATORY_CARE_PROVIDER_SITE_OTHER): Payer: BLUE CROSS/BLUE SHIELD | Admitting: Nurse Practitioner

## 2016-12-17 VITALS — BP 120/76 | HR 64 | Ht 66.75 in | Wt 228.0 lb

## 2016-12-17 DIAGNOSIS — Z0000 Encounter for general adult medical examination without abnormal findings: Secondary | ICD-10-CM

## 2016-12-17 DIAGNOSIS — N761 Subacute and chronic vaginitis: Secondary | ICD-10-CM

## 2016-12-17 DIAGNOSIS — Z01419 Encounter for gynecological examination (general) (routine) without abnormal findings: Secondary | ICD-10-CM

## 2016-12-17 DIAGNOSIS — Z23 Encounter for immunization: Secondary | ICD-10-CM

## 2016-12-17 MED ORDER — NORETHIN-ETH ESTRAD TRIPHASIC 0.5/0.75/1-35 MG-MCG PO TABS: 1.0000 | ORAL_TABLET | Freq: Every day | ORAL | 4 refills | Status: DC

## 2016-12-17 MED ORDER — METRONIDAZOLE 0.75 % VA GEL: VAGINAL | 1 refills | Status: DC

## 2016-12-17 MED ORDER — NORETHIN ACE-ETH ESTRAD-FE 1-20 MG-MCG PO TABS: 1.0000 | ORAL_TABLET | Freq: Every day | ORAL | 4 refills | Status: DC

## 2016-12-17 NOTE — Progress Notes (Addendum)
46 y.o. [id number] Single African American Fe here for annual exam.  menses now at 5 days and very light.  Some cramps.  Now willing to try a lower dose OCP.  Will given her Loestrin after completion of this one in ~ 2 months.  Same partner ~ 20 yrs.  Patient's last menstrual period was 12/07/2016 (exact date).          Sexually active: Yes.    The current method of family planning is OCP (estrogen/progesterone).    Exercising: No.  The patient does not participate in regular exercise at present. Smoker:  no  Health Maintenance: Pap: 12/03/14, Negative with neg HR HPV  11/23/11, Negative with neg HR HPV History of Abnormal Pap: yes, ~2004 MMG: 06/10/16, 3D-no, Density Category B, Bi-Rads 1:  Negative  Self Breast exams: sometimes TDaP: 07/24/07 HIV: 12/03/14 Labs: Come back for fasting labs   reports that she has never smoked. She has never used smokeless tobacco. She reports that she does not drink alcohol or use drugs.  Past Medical History:  Diagnosis Date  . Abnormal pap about 2004  . HSV infection    Asymptomatic    Past Surgical History:  Procedure Laterality Date  . CESAREAN SECTION  04/24/2003  . WISDOM TOOTH EXTRACTION  early 20's    Current Outpatient Prescriptions  Medication Sig Dispense Refill  . cholecalciferol (VITAMIN D) 1000 UNITS tablet Take 1,000 Units by mouth daily.    . metroNIDAZOLE (METROGEL) 0.75 % vaginal gel Use as directed after menses 70 g 1  . metroNIDAZOLE (METROGEL) 0.75 % vaginal gel Place 1 Applicatorful vaginally at bedtime. 70 g 0  . norethindrone-ethinyl estradiol (JUNEL FE,GILDESS FE,LOESTRIN FE) 1-20 MG-MCG tablet Take 1 tablet by mouth daily. 3 Package 4   No current facility-administered medications for this visit.     Family History  Problem Relation Age of Onset  . Diabetes Mother   . Hypertension Mother   . Heart disease Mother   . COPD Mother   . Diabetes Father   . Diabetes Maternal Grandmother   . Hypertension Maternal  Grandmother   . Heart disease Maternal Grandmother   . Heart failure Maternal Grandfather   . Heart disease Maternal Grandfather     ROS:  Pertinent items are noted in HPI.  Otherwise, a comprehensive ROS was negative.  Exam:   BP 120/76 (BP Location: Right Arm, Patient Position: Sitting, Cuff Size: Large)   Pulse 64   Ht 5' 6.75" (1.695 m)   Wt 228 lb (103.4 kg)   LMP 12/07/2016 (Exact Date)   BMI 35.98 kg/m  Height: 5' 6.75" (169.5 cm) Ht Readings from Last 3 Encounters:  12/17/16 5' 6.75" (1.695 m)  12/05/15 5\' 7"  (1.702 m)  10/28/15 5' 7.25" (1.708 m)    General appearance: alert, cooperative and appears stated age Head: Normocephalic, without obvious abnormality, atraumatic Neck: no adenopathy, supple, symmetrical, trachea midline and thyroid normal to inspection and palpation Lungs: clear to auscultation bilaterally Breasts: normal appearance, no masses or tenderness Heart: regular rate and rhythm Abdomen: soft, non-tender; no masses,  no organomegaly Extremities: extremities normal, atraumatic, no cyanosis or edema Skin: Skin color, texture, turgor normal. No rashes or lesions Lymph nodes: Cervical, supraclavicular, and axillary nodes normal. No abnormal inguinal nodes palpated Neurologic: Grossly normal   Pelvic: External genitalia:  no lesions              Urethra:  normal appearing urethra with no masses, tenderness or lesions  Bartholin's and Skene's: normal                 Vagina: normal appearing vagina with normal color and discharge, no lesions              Cervix: anteverted              Pap taken: No. Bimanual Exam:  Uterus:  normal size, contour, position, consistency, mobility, non-tender              Adnexa: no mass, fullness, tenderness               Rectovaginal: Confirms               Anus:  normal sphincter tone, no lesions  Chaperone present: yes  A:  Well Woman with normal exam    OCP for Contraception - will change to a lower  dose Intermenstrual bleeding for several yrs - not usually related to SA and occurs about every 3-4 months. Had PUS SHGM on 12/20/2013 showing an endo polyp and 4 small fibroids - considering removal of polyp to stop BTB. Still does not desire treatment/ surgery, but if BTB is worse with this OCP to call back. 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  Counseled on breast self exam, mammography screening, use and side effects of OCP's, adequate intake of calcium and vitamin D, diet and exercise return annually or prn  An After Visit Summary was printed and given to the patient.

## 2016-12-17 NOTE — Patient Instructions (Signed)

## 2016-12-18 LAB — WET PREP BY MOLECULAR PROBE
Candida species: NOT DETECTED
GARDNERELLA VAGINALIS: DETECTED — AB
TRICHOMONAS VAG: NOT DETECTED

## 2016-12-19 NOTE — Progress Notes (Signed)
Encounter reviewed by Dr. Aundria Rud. If patient continuing to have intermenstrual bleeding, I recommend consideration for cotesting (pap and HR HPV), repeat sonohysterogram and EMB. She had an identifiable intrauterine mass in 2015.

## 2016-12-20 ENCOUNTER — Other Ambulatory Visit: Payer: Self-pay | Admitting: Nurse Practitioner

## 2016-12-20 MED ORDER — METRONIDAZOLE 0.75 % VA GEL: 1.0000 | Freq: Every day | VAGINAL | 0 refills | Status: DC

## 2016-12-22 ENCOUNTER — Other Ambulatory Visit: Payer: BLUE CROSS/BLUE SHIELD

## 2016-12-22 ENCOUNTER — Other Ambulatory Visit: Payer: Self-pay | Admitting: Nurse Practitioner

## 2016-12-22 DIAGNOSIS — Z0000 Encounter for general adult medical examination without abnormal findings: Secondary | ICD-10-CM

## 2016-12-22 LAB — COMPREHENSIVE METABOLIC PANEL
ALK PHOS: 54 U/L (ref 33–115)
ALT: 9 U/L (ref 6–29)
AST: 15 U/L (ref 10–30)
Albumin: 3.7 g/dL (ref 3.6–5.1)
BUN: 8 mg/dL (ref 7–25)
CALCIUM: 8.8 mg/dL (ref 8.6–10.2)
CHLORIDE: 104 mmol/L (ref 98–110)
CO2: 21 mmol/L (ref 20–31)
Creat: 0.85 mg/dL (ref 0.50–1.10)
Glucose, Bld: 94 mg/dL (ref 65–99)
POTASSIUM: 4.1 mmol/L (ref 3.5–5.3)
Sodium: 140 mmol/L (ref 135–146)
TOTAL PROTEIN: 6.5 g/dL (ref 6.1–8.1)
Total Bilirubin: 0.3 mg/dL (ref 0.2–1.2)

## 2016-12-22 LAB — CBC
HEMATOCRIT: 37.9 % (ref 35.0–45.0)
HEMOGLOBIN: 12.1 g/dL (ref 11.7–15.5)
MCH: 27.5 pg (ref 27.0–33.0)
MCHC: 31.9 g/dL — AB (ref 32.0–36.0)
MCV: 86.1 fL (ref 80.0–100.0)
MPV: 10.1 fL (ref 7.5–12.5)
Platelets: 289 10*3/uL (ref 140–400)
RBC: 4.4 MIL/uL (ref 3.80–5.10)
RDW: 13.7 % (ref 11.0–15.0)
WBC: 5.9 10*3/uL (ref 3.8–10.8)

## 2016-12-22 LAB — LIPID PANEL
CHOL/HDL RATIO: 5 ratio — AB (ref <5.0)
CHOLESTEROL: 215 mg/dL — AB (ref <200)
HDL: 43 mg/dL — AB (ref >50)
LDL Cholesterol: 126 mg/dL — ABNORMAL HIGH (ref <100)
TRIGLYCERIDES: 228 mg/dL — AB (ref <150)
VLDL: 46 mg/dL — ABNORMAL HIGH (ref <30)

## 2016-12-23 ENCOUNTER — Encounter: Payer: Self-pay | Admitting: Nurse Practitioner

## 2016-12-23 LAB — HEMOGLOBIN A1C
HEMOGLOBIN A1C: 5.8 % — AB (ref <5.7)
MEAN PLASMA GLUCOSE: 120 mg/dL

## 2016-12-23 LAB — TSH: TSH: 1.63 m[IU]/L

## 2016-12-23 LAB — VITAMIN D 25 HYDROXY (VIT D DEFICIENCY, FRACTURES): VIT D 25 HYDROXY: 29 ng/mL — AB (ref 30–100)

## 2016-12-28 ENCOUNTER — Telehealth: Payer: Self-pay | Admitting: Nurse Practitioner

## 2016-12-28 DIAGNOSIS — N939 Abnormal uterine and vaginal bleeding, unspecified: Secondary | ICD-10-CM

## 2016-12-28 NOTE — Telephone Encounter (Signed)
Left pt a message to CB and discuss endo polyp with bleeding.

## 2016-12-28 NOTE — Telephone Encounter (Signed)
Return call to Edman Circle, FNP.

## 2017-01-05 NOTE — Telephone Encounter (Signed)
Pt was called about Dr. Ammie Ferrier recommendation of PUS and possible endo biopsy.  She is OK with the plan.  Her last PUS was 12/2013 by Dr. Migdalia Dk.   Ultrasound was ordered per note - but she may need SHGM and endo biopsy.

## 2017-01-11 ENCOUNTER — Other Ambulatory Visit: Payer: Self-pay | Admitting: *Deleted

## 2017-01-11 ENCOUNTER — Telehealth: Payer: Self-pay | Admitting: Nurse Practitioner

## 2017-01-11 DIAGNOSIS — N939 Abnormal uterine and vaginal bleeding, unspecified: Secondary | ICD-10-CM

## 2017-01-11 NOTE — Telephone Encounter (Signed)
Spoke with patient regarding benefit for sonohysterogram with possible endometrial biopsy. Patient understood and agreeable. Patient ready to schedule. Patient scheduled 01/20/17 with Dr Sabra Heck. Patient aware of date, arrival time and cancellation policy. Patient had no further questions.   Routing to [employer]  cc: Hale Bogus

## 2017-01-20 ENCOUNTER — Ambulatory Visit (INDEPENDENT_AMBULATORY_CARE_PROVIDER_SITE_OTHER): Payer: BLUE CROSS/BLUE SHIELD

## 2017-01-20 ENCOUNTER — Ambulatory Visit (INDEPENDENT_AMBULATORY_CARE_PROVIDER_SITE_OTHER): Payer: BLUE CROSS/BLUE SHIELD | Admitting: Obstetrics & Gynecology

## 2017-01-20 ENCOUNTER — Other Ambulatory Visit: Payer: Self-pay | Admitting: Obstetrics & Gynecology

## 2017-01-20 VITALS — BP 136/86 | HR 100 | Resp 14 | Ht 66.75 in | Wt 226.0 lb

## 2017-01-20 DIAGNOSIS — N939 Abnormal uterine and vaginal bleeding, unspecified: Secondary | ICD-10-CM

## 2017-01-20 DIAGNOSIS — N852 Hypertrophy of uterus: Secondary | ICD-10-CM

## 2017-01-20 DIAGNOSIS — D252 Subserosal leiomyoma of uterus: Secondary | ICD-10-CM

## 2017-01-20 DIAGNOSIS — D251 Intramural leiomyoma of uterus: Secondary | ICD-10-CM | POA: Diagnosis not present

## 2017-01-20 DIAGNOSIS — N9489 Other specified conditions associated with female genital organs and menstrual cycle: Secondary | ICD-10-CM | POA: Diagnosis not present

## 2017-01-20 NOTE — Progress Notes (Signed)
GYNECOLOGY  VISIT   46 y.o. [id number] Single African American female here for ultrasound evaluation of occasional midcycle spotting.  Pt has ultasound in 5/15 showing an endometrial polyp.  She desired no surgical intervention at that time.  She is on OCPs.  Spotting has continued only about every three to four months.  Repeat evaluation recommended this year.  GYNECOLOGIC HISTORY: Contraception: OCPs  Patient Active Problem List   Diagnosis Date Noted  . Endometrial polyp 12/20/2013    Past Medical History:  Diagnosis Date  . Abnormal pap about 2004  . HSV infection    Asymptomatic    Past Surgical History:  Procedure Laterality Date  . CESAREAN SECTION  04/24/2003  . WISDOM TOOTH EXTRACTION  early 20's    MEDS:  Reviewed in EPIC and UTD  ALLERGIES: Patient has no known allergies.  Family History  Problem Relation Age of Onset  . Diabetes Mother   . Hypertension Mother   . Heart disease Mother   . COPD Mother   . Diabetes Father   . Diabetes Maternal Grandmother   . Hypertension Maternal Grandmother   . Heart disease Maternal Grandmother   . Heart failure Maternal Grandfather   . Heart disease Maternal Grandfather     SH:  Single, non smoker  Review of Systems  All other systems reviewed and are negative.   PHYSICAL EXAMINATION:    BP 136/86 (BP Location: Right Arm, Patient Position: Sitting, Cuff Size: Large)   Pulse 100   Resp 14   Ht 5' 6.75" (1.695 m)   Wt 226 lb (102.5 kg)   BMI 35.66 kg/m     General appearance: alert, cooperative and appears stated age  Pelvic: External genitalia:  no lesions              Urethra:  normal appearing urethra with no masses, tenderness or lesions              Bartholins and Skenes: normal                 Vagina: normal appearing vagina with normal color and discharge, no lesions              Cervix: no lesions              Bimanual Exam:  Uterus:  Enlarged about 10 weeks size, mobile              Adnexa: no mass,  fullness, tenderness   Technique:  Both transabdominal and transvaginal ultrasound examinations of the pelvis were performed. Transabdominal technique was performed for global imaging of the pelvis including uterus, ovaries, adnexal regions, and pelvic cul-de-sac.  It was necessary to proceed with endovaginal exam following the abdominal ultrasound transabdominal exam to visualize the endometrium and adnexa.  Color and duplex Doppler ultrasound was utilized to evaluate blood flow to the ovaries.   FINDINGS: Uterus: 8.5 x 5.9 x 5.8cm with multiple intramural and subserosal fibroids, largest 4cm.  At least six noted.  In comparison to prior PUS in 2015, there were four fibroids noted and largest was 2.2cm. Endometrium: 7.28mm Adnexa:  Left: 3.5 x 1.4 x 1.6cm     Right: 2.6 x 3.1 x 2.7cm with 2.4 x 2.2cm corpus luteal cyst Cul de sac: no free fluid  SHSG:  After obtaining appropriate verbal consent from patient, the cervix was visualized using a speculum, and prepped with betadine.  A tenaculum  was applied to the cervix.  Dilation of the  cervix was not necessary. The catheter was passed into the uterus and sterile saline introduced, with the following findings: two likely polyps noted.  No fibroid note within endometrial cavity.  Endometrial biopsy recommended. Verbal and written consent obtained.  Speculum placed.  Cervix visualized and cleansed with betadine prep.  A single toothed tenaculum was applied to the anterior lip of the cervix.  Endometrial pipelle was advanced through the cervix into the endometrial cavity without difficulty.  Pipelle passed to 8cm.  Suction applied and pipelle removed with good tissue sample obtained.  Tenculum removed.  No bleeding noted.  Patient tolerated procedure well.  All instruments removed.  Discussion:  Findings reviewed.  Comparison made to prior ultrasound.  Hysteroscopy recommended due to two polyps now that have continued to be symptomatic.  Pt states she  actually would like a hysterectomy and be done with bleeding and hormonal therapy.  She states she cannot do this until early next year and would like discuss dates for procedure but for now declines anything else from evaluation or treatment standpoint.  Assessment: Enlarged uterus with fibroids, increased in size and number since 2015 Irregular mid-cycle spotting On OCPs  Plan: Endometrial biopsy is pending.  Results will be called to pt.  Will make plans for surgery as pt directs.  Again, she declines doing anything else this year.   ~30 minutes spent with patient >50% of time was in face to face discussion of above.

## 2017-01-23 ENCOUNTER — Encounter: Payer: Self-pay | Admitting: Obstetrics & Gynecology

## 2017-01-23 DIAGNOSIS — D252 Subserosal leiomyoma of uterus: Principal | ICD-10-CM

## 2017-01-23 DIAGNOSIS — N939 Abnormal uterine and vaginal bleeding, unspecified: Secondary | ICD-10-CM | POA: Insufficient documentation

## 2017-01-23 DIAGNOSIS — D251 Intramural leiomyoma of uterus: Secondary | ICD-10-CM | POA: Insufficient documentation

## 2017-03-18 ENCOUNTER — Telehealth: Payer: Self-pay | Admitting: Obstetrics and Gynecology

## 2017-03-18 NOTE — Telephone Encounter (Signed)
Left message on voicemail to call and reschedule cancelled appointment. Mail letter °

## 2017-05-20 ENCOUNTER — Other Ambulatory Visit: Payer: Self-pay | Admitting: Certified Nurse Midwife

## 2017-05-20 DIAGNOSIS — Z1231 Encounter for screening mammogram for malignant neoplasm of breast: Secondary | ICD-10-CM

## 2017-06-14 ENCOUNTER — Ambulatory Visit
Admission: RE | Admit: 2017-06-14 | Discharge: 2017-06-14 | Disposition: A | Discharge status: Home / Self Care | Payer: BLUE CROSS/BLUE SHIELD | Source: Ambulatory Visit | Attending: Certified Nurse Midwife | Admitting: Certified Nurse Midwife

## 2017-06-14 DIAGNOSIS — Z1231 Encounter for screening mammogram for malignant neoplasm of breast: Secondary | ICD-10-CM

## 2017-12-28 ENCOUNTER — Ambulatory Visit: Payer: BLUE CROSS/BLUE SHIELD | Admitting: Certified Nurse Midwife

## 2017-12-28 ENCOUNTER — Other Ambulatory Visit: Payer: Self-pay | Admitting: Certified Nurse Midwife

## 2017-12-28 ENCOUNTER — Encounter: Payer: Self-pay | Admitting: Certified Nurse Midwife

## 2017-12-28 ENCOUNTER — Ambulatory Visit: Payer: BLUE CROSS/BLUE SHIELD | Admitting: Nurse Practitioner

## 2017-12-28 DIAGNOSIS — IMO0001 Reserved for inherently not codable concepts without codable children: Secondary | ICD-10-CM

## 2017-12-28 NOTE — Telephone Encounter (Signed)
Message   ----- Message from [place], Generic sent at 12/28/2017 9:34 AM EDT -----    I have my annual appointment at the end of the month but I will be out of my birth control pills before appointment, can you all send in a Rx to [facility]. ? I usually use home delivery but I will need them by next week.

## 2017-12-28 NOTE — Telephone Encounter (Signed)
Medication refill request: OCP   Last AEX:  12-17-16  Next AEX: 01-11-18 Last MMG (if hormonal medication request): 06-14-17 WNL  Refill authorized: please advise

## 2017-12-29 MED ORDER — NORETHIN ACE-ETH ESTRAD-FE 1-20 MG-MCG PO TABS: 1.0000 | ORAL_TABLET | Freq: Every day | ORAL | 0 refills | Status: DC

## 2018-01-11 ENCOUNTER — Other Ambulatory Visit: Payer: Self-pay

## 2018-01-11 ENCOUNTER — Ambulatory Visit: Payer: No Typology Code available for payment source | Admitting: Certified Nurse Midwife

## 2018-01-11 ENCOUNTER — Encounter: Payer: Self-pay | Admitting: Certified Nurse Midwife

## 2018-01-11 ENCOUNTER — Other Ambulatory Visit (HOSPITAL_COMMUNITY)
Admission: RE | Admit: 2018-01-11 | Discharge: 2018-01-11 | Disposition: A | Discharge status: Home / Self Care | Payer: No Typology Code available for payment source | Source: Ambulatory Visit | Attending: Certified Nurse Midwife | Admitting: Certified Nurse Midwife

## 2018-01-11 VITALS — BP 106/68 | HR 68 | Resp 16 | Ht 67.25 in | Wt 231.0 lb

## 2018-01-11 DIAGNOSIS — Z01411 Encounter for gynecological examination (general) (routine) with abnormal findings: Secondary | ICD-10-CM

## 2018-01-11 DIAGNOSIS — Z789 Other specified health status: Secondary | ICD-10-CM | POA: Diagnosis not present

## 2018-01-11 DIAGNOSIS — Z124 Encounter for screening for malignant neoplasm of cervix: Secondary | ICD-10-CM | POA: Diagnosis not present

## 2018-01-11 DIAGNOSIS — N852 Hypertrophy of uterus: Secondary | ICD-10-CM

## 2018-01-11 DIAGNOSIS — Z86018 Personal history of other benign neoplasm: Secondary | ICD-10-CM | POA: Diagnosis not present

## 2018-01-11 DIAGNOSIS — Z1211 Encounter for screening for malignant neoplasm of colon: Secondary | ICD-10-CM | POA: Diagnosis not present

## 2018-01-11 DIAGNOSIS — R6889 Other general symptoms and signs: Secondary | ICD-10-CM

## 2018-01-11 DIAGNOSIS — N898 Other specified noninflammatory disorders of vagina: Secondary | ICD-10-CM

## 2018-01-11 DIAGNOSIS — E663 Overweight: Secondary | ICD-10-CM

## 2018-01-11 DIAGNOSIS — E559 Vitamin D deficiency, unspecified: Secondary | ICD-10-CM

## 2018-01-11 DIAGNOSIS — IMO0001 Reserved for inherently not codable concepts without codable children: Secondary | ICD-10-CM

## 2018-01-11 MED ORDER — METRONIDAZOLE 0.75 % VA GEL: 1.0000 | Freq: Every day | VAGINAL | 1 refills | Status: DC

## 2018-01-11 MED ORDER — NORETHIN ACE-ETH ESTRAD-FE 1-20 MG-MCG PO TABS: 1.0000 | ORAL_TABLET | Freq: Every day | ORAL | 4 refills | Status: DC

## 2018-01-11 NOTE — Progress Notes (Signed)
47 y.o. A6T0160 Single  African American Fe here for annual exam. Periods normal, no issues. Some spotting occasional, from fibroids,but not consistent or heavy. Working on weight loss. 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 Desires screening labs and continuance of OCP for contraception. Continues with BV on occasion after menses, has had Metrogel on hand to treat, would like this again if possible. No other health issues today.  Patient's last menstrual period was 01/03/2018 (exact date).          Sexually active: Yes.    The current method of family planning is OCP (estrogen/progesterone).    Exercising: Yes.    gym Smoker:  no  Health Maintenance: Pap:  12-03-14 neg HPV HR neg History of Abnormal Pap: yes MMG:  06-14-17 category b density birads 1:neg Self Breast exams: no Colonoscopy:  none BMD:   none TDaP:  2018 Shingles: no Pneumonia: no Hep C and HIV: HIV neg 2016 Labs: if needed   reports that she has never smoked. She has never used smokeless tobacco. She reports that she does not drink alcohol or use drugs.  Past Medical History:  Diagnosis Date  . Abnormal pap about 2004  . HSV infection    Asymptomatic    Past Surgical History:  Procedure Laterality Date  . CESAREAN SECTION  04/24/2003  . WISDOM TOOTH EXTRACTION  early 20's    Current Outpatient Medications  Medication Sig Dispense Refill  . cetirizine (ZYRTEC) 10 MG tablet Take 10 mg by mouth as needed for allergies.    . cholecalciferol (VITAMIN D) 1000 UNITS tablet Take 1,000 Units by mouth as needed.     . metroNIDAZOLE (METROGEL) 0.75 % vaginal gel Place 1 Applicatorful vaginally at bedtime. 70 g 0  . norethindrone-ethinyl estradiol (JUNEL FE,GILDESS FE,LOESTRIN FE) 1-20 MG-MCG tablet Take 1 tablet by mouth daily. 3 Package 0   No current facility-administered medications for this visit.     Family History  Problem Relation Age of Onset  . Diabetes Mother   . Hypertension  Mother   . Heart disease Mother   . COPD Mother   . Diabetes Father   . Diabetes Maternal Grandmother   . Hypertension Maternal Grandmother   . Heart disease Maternal Grandmother   . Heart failure Maternal Grandfather   . Heart disease Maternal Grandfather     ROS:  Pertinent items are noted in HPI.  Otherwise, a comprehensive ROS was negative.  Exam:   BP 106/68   Pulse 68   Resp 16   Ht 5' 7.25" (1.708 m)   Wt 231 lb (104.8 kg)   LMP 01/03/2018 (Exact Date)   BMI 35.91 kg/m  Height: 5' 7.25" (170.8 cm) Ht Readings from Last 3 Encounters:  01/11/18 5' 7.25" (1.708 m)  01/20/17 5' 6.75" (1.695 m)  12/17/16 5' 6.75" (1.695 m)    General appearance: alert, cooperative and appears stated age Head: Normocephalic, without obvious abnormality, atraumatic Neck: no adenopathy, supple, symmetrical, trachea midline and thyroid normal to inspection and palpation Lungs: clear to auscultation bilaterally Breasts: normal appearance, no masses or tenderness, No nipple retraction or dimpling, No nipple discharge or bleeding, No axillary or supraclavicular adenopathy Heart: regular rate and rhythm Abdomen: soft, non-tender; no masses,  no organomegaly Extremities: extremities normal, atraumatic, no cyanosis or edema Skin: Skin color, texture, turgor normal. No rashes or lesions Lymph nodes: Cervical, supraclavicular, and axillary nodes normal. No abnormal inguinal nodes palpated Neurologic: Grossly normal   Pelvic:  External genitalia:  no lesions              Urethra:  normal appearing urethra with no masses, tenderness or lesions              Bartholin's and Skene's: normal                 Vagina: normal appearing vagina with normal color and discharge, no lesions              Cervix: no cervical motion tenderness, no lesions and normal appearance              Pap taken: Yes.   Bimanual Exam:  Uterus:  enlarged, 12-13 weeks size history of known fibroids, no size change from last  recorded exam              Adnexa: normal adnexa and no mass, fullness, tenderness               Rectovaginal: Confirms               Anus:  normal sphincter tone, no lesions  Chaperone present: yes  A:  Well Woman with normal exam  Contraception OCP desired  History of BV after menses request Rx to have if needed.  History of uterine fibroids no change in uterine size occasionally symptomatic with spotting only  Screening labs  Colonoscopy risks/benefits/ discussed declines at this point. Needs to do IFOB.  P:   Reviewed health and wellness pertinent to exam  Risks/benefits/warning signs reviewed. Request continuance.  Rx Junel Fe see order with instructions  Rx Metrogel see order with instructions  Discussed warning signs with heavy bleeding, increase urinary symptoms reviewed and need to advise.  Labs:CMP, Hgb A1-C, Lipid panel TSH, Vit. D  Patient will be called with information and dispensed  Pap smear: yes   counseled on breast self exam, mammography screening, use and side effects of OCP's, adequate intake of calcium and vitamin D, diet and exercise  return annually or prn  An After Visit Summary was printed and given to the patient.

## 2018-01-11 NOTE — Patient Instructions (Signed)

## 2018-01-13 LAB — LIPID PANEL
CHOLESTEROL TOTAL: 204 mg/dL — AB (ref 100–199)
Chol/HDL Ratio: 4.5 ratio — ABNORMAL HIGH (ref 0.0–4.4)
HDL: 45 mg/dL (ref >39)
LDL Calculated: 116 mg/dL — ABNORMAL HIGH (ref 0–99)
TRIGLYCERIDES: 216 mg/dL — AB (ref 0–149)
VLDL CHOLESTEROL CAL: 43 mg/dL — AB (ref 5–40)

## 2018-01-13 LAB — COMPREHENSIVE METABOLIC PANEL
ALBUMIN: 4.1 g/dL (ref 3.5–5.5)
ALK PHOS: 74 IU/L (ref 39–117)
ALT: 16 IU/L (ref 0–32)
AST: 21 IU/L (ref 0–40)
Albumin/Globulin Ratio: 1.5 (ref 1.2–2.2)
BUN / CREAT RATIO: 9 (ref 9–23)
BUN: 7 mg/dL (ref 6–24)
Bilirubin Total: 0.2 mg/dL (ref 0.0–1.2)
CALCIUM: 9.2 mg/dL (ref 8.7–10.2)
CO2: 18 mmol/L — AB (ref 20–29)
CREATININE: 0.8 mg/dL (ref 0.57–1.00)
Chloride: 100 mmol/L (ref 96–106)
GFR calc Af Amer: 103 mL/min/{1.73_m2} (ref >59)
GFR, EST NON AFRICAN AMERICAN: 89 mL/min/{1.73_m2} (ref >59)
GLOBULIN, TOTAL: 2.8 g/dL (ref 1.5–4.5)
GLUCOSE: 105 mg/dL — AB (ref 65–99)
Potassium: 4.2 mmol/L (ref 3.5–5.2)
SODIUM: 139 mmol/L (ref 134–144)
TOTAL PROTEIN: 6.9 g/dL (ref 6.0–8.5)

## 2018-01-13 LAB — VITAMIN D 25 HYDROXY (VIT D DEFICIENCY, FRACTURES): VIT D 25 HYDROXY: 28.3 ng/mL — AB (ref 30.0–100.0)

## 2018-01-13 LAB — CYTOLOGY - PAP
DIAGNOSIS: NEGATIVE
HPV (WINDOPATH): NOT DETECTED

## 2018-01-13 LAB — HEMOGLOBIN A1C
Est. average glucose Bld gHb Est-mCnc: 128 mg/dL
Hgb A1c MFr Bld: 6.1 % — ABNORMAL HIGH (ref 4.8–5.6)

## 2018-01-13 LAB — TSH: TSH: 0.889 u[IU]/mL (ref 0.450–4.500)

## 2018-03-09 ENCOUNTER — Encounter: Payer: Self-pay | Admitting: Certified Nurse Midwife

## 2018-05-29 ENCOUNTER — Other Ambulatory Visit: Payer: Self-pay | Admitting: Certified Nurse Midwife

## 2018-05-29 DIAGNOSIS — Z1231 Encounter for screening mammogram for malignant neoplasm of breast: Secondary | ICD-10-CM

## 2018-07-05 ENCOUNTER — Other Ambulatory Visit: Payer: Self-pay | Admitting: Certified Nurse Midwife

## 2018-07-05 ENCOUNTER — Ambulatory Visit
Admission: RE | Admit: 2018-07-05 | Discharge: 2018-07-05 | Disposition: A | Discharge status: Home / Self Care | Payer: No Typology Code available for payment source | Source: Ambulatory Visit | Attending: Certified Nurse Midwife | Admitting: Certified Nurse Midwife

## 2018-07-05 DIAGNOSIS — Z1231 Encounter for screening mammogram for malignant neoplasm of breast: Secondary | ICD-10-CM

## 2019-01-10 ENCOUNTER — Telehealth: Payer: Self-pay | Admitting: Certified Nurse Midwife

## 2019-01-10 NOTE — Telephone Encounter (Signed)
Left message on voicemail to call and reschedule cancelled appointment. °

## 2019-01-17 ENCOUNTER — Ambulatory Visit: Payer: No Typology Code available for payment source | Admitting: Certified Nurse Midwife

## 2019-01-22 ENCOUNTER — Other Ambulatory Visit: Payer: Self-pay

## 2019-01-24 ENCOUNTER — Ambulatory Visit (INDEPENDENT_AMBULATORY_CARE_PROVIDER_SITE_OTHER): Payer: No Typology Code available for payment source | Admitting: Certified Nurse Midwife

## 2019-01-24 ENCOUNTER — Other Ambulatory Visit: Payer: Self-pay

## 2019-01-24 ENCOUNTER — Encounter: Payer: Self-pay | Admitting: Certified Nurse Midwife

## 2019-01-24 VITALS — BP 118/80 | HR 70 | Temp 97.6°F | Resp 16 | Ht 66.75 in | Wt 240.0 lb

## 2019-01-24 DIAGNOSIS — Z01419 Encounter for gynecological examination (general) (routine) without abnormal findings: Secondary | ICD-10-CM

## 2019-01-24 DIAGNOSIS — N852 Hypertrophy of uterus: Secondary | ICD-10-CM

## 2019-01-24 DIAGNOSIS — Z3040 Encounter for surveillance of contraceptives, unspecified: Secondary | ICD-10-CM | POA: Diagnosis not present

## 2019-01-24 DIAGNOSIS — Z0000 Encounter for general adult medical examination without abnormal findings: Secondary | ICD-10-CM | POA: Diagnosis not present

## 2019-01-24 DIAGNOSIS — Z86018 Personal history of other benign neoplasm: Secondary | ICD-10-CM

## 2019-01-24 MED ORDER — NORETHIN ACE-ETH ESTRAD-FE 1-20 MG-MCG PO TABS: 1.0000 | ORAL_TABLET | Freq: Every day | ORAL | 4 refills | Status: DC

## 2019-01-24 NOTE — Progress Notes (Signed)
48 y.o. [id number] Single  African American Fe here for annual exam. Periods still heavy 2 days and then light, managing well. Still having spotting at times due to fibroids. No change in symptoms.Contraception working well, desires continuance. No warning signs noted. 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 No PCP at this point, would like screening labs today. No other health issues.   Patient's last menstrual period was 01/09/2019 (exact date).          Sexually active: Yes.    The current method of family planning is OCP (estrogen/progesterone).    Exercising: No.  exercise Smoker:  no  Review of Systems  Constitutional: Negative.   HENT: Negative.   Eyes: Negative.   Respiratory: Negative.   Cardiovascular: Negative.   Gastrointestinal: Negative.   Genitourinary:       Heavier cycles with cramping, night sweats  Musculoskeletal: Negative.   Skin: Negative.   Neurological: Negative.   Endo/Heme/Allergies: Negative.   Psychiatric/Behavioral: Negative.     Health Maintenance: Pap:  12-03-14 neg HPV HR neg, 01-11-18 neg HPV HR neg History of Abnormal Pap: yes MMG:  07-05-18 category b density birads 1:neg Self Breast exams: occ Colonoscopy:  none BMD:   none TDaP:  2018 Shingles: no Pneumonia: no Hep C and HIV: HIV neg 2016 Labs: if needed   reports that she has never smoked. She has never used smokeless tobacco. She reports that she does not drink alcohol or use drugs.  Past Medical History:  Diagnosis Date  . Abnormal pap about 2004  . HSV infection    Asymptomatic    Past Surgical History:  Procedure Laterality Date  . CESAREAN SECTION  04/24/2003  . WISDOM TOOTH EXTRACTION  early 20's    Current Outpatient Medications  Medication Sig Dispense Refill  . cetirizine (ZYRTEC) 10 MG tablet Take 10 mg by mouth as needed for allergies.    . cholecalciferol (VITAMIN D) 1000 UNITS tablet Take 1,000 Units by mouth as needed.     . norethindrone-ethinyl estradiol (JUNEL  FE,GILDESS FE,LOESTRIN FE) 1-20 MG-MCG tablet Take 1 tablet by mouth daily. 3 Package 4   No current facility-administered medications for this visit.     Family History  Problem Relation Age of Onset  . Diabetes Mother   . Hypertension Mother   . Heart disease Mother   . COPD Mother   . Diabetes Father   . Diabetes Maternal Grandmother   . Hypertension Maternal Grandmother   . Heart disease Maternal Grandmother   . Heart failure Maternal Grandfather   . Heart disease Maternal Grandfather     ROS:  Pertinent items are noted in HPI.  Otherwise, a comprehensive ROS was negative.  Exam:   BP 118/80   Pulse 70   Temp 97.6 F (36.4 C) (Skin)   Resp 16   Ht 5' 6.75" (1.695 m)   Wt 240 lb (108.9 kg)   LMP 01/09/2019 (Exact Date)   BMI 37.87 kg/m  Height: 5' 6.75" (169.5 cm) Ht Readings from Last 3 Encounters:  01/24/19 5' 6.75" (1.695 m)  01/11/18 5' 7.25" (1.708 m)  01/20/17 5' 6.75" (1.695 m)    General appearance: alert, cooperative and appears stated age Head: Normocephalic, without obvious abnormality, atraumatic Neck: no adenopathy, supple, symmetrical, trachea midline and thyroid normal to inspection and palpation Lungs: clear to auscultation bilaterally Breasts: normal appearance, no masses or tenderness, No nipple retraction or dimpling, No nipple discharge or bleeding, No axillary or supraclavicular adenopathy Heart: regular  rate and rhythm Abdomen: soft, non-tender; no masses,  no organomegaly Extremities: extremities normal, atraumatic, no cyanosis or edema Skin: Skin color, texture, turgor normal. No rashes or lesions Lymph nodes: Cervical, supraclavicular, and axillary nodes normal. No abnormal inguinal nodes palpated Neurologic: Grossly normal   Pelvic: External genitalia:  no lesions, normal female              Urethra:  normal appearing urethra with no masses, tenderness or lesions              Bartholin's and Skene's: normal                 Vagina:  normal appearing vagina with normal color and discharge, no lesions              Cervix: multiparous appearance, no cervical motion tenderness and no lesions              Pap taken: No. Bimanual Exam:  Uterus:  normal size, contour, position, consistency, mobility, non-tender and anteverted              Adnexa: normal adnexa and no mass, fullness, tenderness               Rectovaginal: Confirms               Anus:  normal sphincter tone, no lesions  Chaperone present: yes  A:  Well Woman with normal exam  Contraception OCP desired  Enlarged uterus history of fibroids, no size change  Screening labs    P:   Reviewed health and wellness pertinent to exam  Risks/benefits/warning signs reviewed and need to advise if occurs.  Rx Loestrin 1/20 Fe see order  Discussed no change size and need to advise if periods become heavier or changing.  Labs: CMP,Lipid panel, CBC,TSH  Pap smear: no   counseled on breast self exam, mammography screening, feminine hygiene, use and side effects of OCP's, adequate intake of calcium and vitamin D, diet and exercise  return annually or prn  An After Visit Summary was printed and given to the patient.

## 2019-01-25 ENCOUNTER — Other Ambulatory Visit: Payer: Self-pay | Admitting: Certified Nurse Midwife

## 2019-01-25 DIAGNOSIS — R899 Unspecified abnormal finding in specimens from other organs, systems and tissues: Secondary | ICD-10-CM

## 2019-01-25 LAB — COMPREHENSIVE METABOLIC PANEL
ALT: 13 IU/L (ref 0–32)
AST: 17 IU/L (ref 0–40)
Albumin/Globulin Ratio: 1.4 (ref 1.2–2.2)
Albumin: 4.1 g/dL (ref 3.8–4.8)
Alkaline Phosphatase: 80 IU/L (ref 39–117)
BUN/Creatinine Ratio: 14 (ref 9–23)
BUN: 11 mg/dL (ref 6–24)
Bilirubin Total: <0.2 mg/dL (ref 0.0–1.2)
CO2: 23 mmol/L (ref 20–29)
Calcium: 9.7 mg/dL (ref 8.7–10.2)
Chloride: 98 mmol/L (ref 96–106)
Creatinine, Ser: 0.8 mg/dL (ref 0.57–1.00)
GFR calc Af Amer: 102 mL/min/{1.73_m2} (ref >59)
GFR calc non Af Amer: 89 mL/min/{1.73_m2} (ref >59)
Globulin, Total: 3 g/dL (ref 1.5–4.5)
Glucose: 81 mg/dL (ref 65–99)
Potassium: 3.7 mmol/L (ref 3.5–5.2)
Sodium: 136 mmol/L (ref 134–144)
Total Protein: 7.1 g/dL (ref 6.0–8.5)

## 2019-01-25 LAB — CBC
Hematocrit: 39.6 % (ref 34.0–46.6)
Hemoglobin: 12.6 g/dL (ref 11.1–15.9)
MCH: 27.3 pg (ref 26.6–33.0)
MCHC: 31.8 g/dL (ref 31.5–35.7)
MCV: 86 fL (ref 79–97)
Platelets: 314 10*3/uL (ref 150–450)
RBC: 4.61 x10E6/uL (ref 3.77–5.28)
RDW: 13.4 % (ref 11.7–15.4)
WBC: 10.2 10*3/uL (ref 3.4–10.8)

## 2019-01-25 LAB — VITAMIN D 25 HYDROXY (VIT D DEFICIENCY, FRACTURES): Vit D, 25-Hydroxy: 36.9 ng/mL (ref 30.0–100.0)

## 2019-01-25 LAB — TSH: TSH: 1.28 u[IU]/mL (ref 0.450–4.500)

## 2019-01-25 LAB — LIPID PANEL
Chol/HDL Ratio: 4.6 ratio — ABNORMAL HIGH (ref 0.0–4.4)
Cholesterol, Total: 208 mg/dL — ABNORMAL HIGH (ref 100–199)
HDL: 45 mg/dL (ref >39)
LDL Calculated: 120 mg/dL — ABNORMAL HIGH (ref 0–99)
Triglycerides: 215 mg/dL — ABNORMAL HIGH (ref 0–149)
VLDL Cholesterol Cal: 43 mg/dL — ABNORMAL HIGH (ref 5–40)

## 2019-02-04 ENCOUNTER — Other Ambulatory Visit: Payer: Self-pay | Admitting: Certified Nurse Midwife

## 2019-02-04 DIAGNOSIS — Z3040 Encounter for surveillance of contraceptives, unspecified: Secondary | ICD-10-CM

## 2019-04-27 ENCOUNTER — Other Ambulatory Visit: Payer: Self-pay

## 2019-04-30 ENCOUNTER — Other Ambulatory Visit (INDEPENDENT_AMBULATORY_CARE_PROVIDER_SITE_OTHER): Payer: No Typology Code available for payment source

## 2019-04-30 ENCOUNTER — Other Ambulatory Visit: Payer: Self-pay

## 2019-04-30 DIAGNOSIS — R899 Unspecified abnormal finding in specimens from other organs, systems and tissues: Secondary | ICD-10-CM

## 2019-05-01 LAB — LIPID PANEL
Chol/HDL Ratio: 5.3 ratio — ABNORMAL HIGH (ref 0.0–4.4)
Cholesterol, Total: 227 mg/dL — ABNORMAL HIGH (ref 100–199)
HDL: 43 mg/dL (ref >39)
LDL Chol Calc (NIH): 161 mg/dL — ABNORMAL HIGH (ref 0–99)
Triglycerides: 128 mg/dL (ref 0–149)
VLDL Cholesterol Cal: 23 mg/dL (ref 5–40)

## 2019-05-02 ENCOUNTER — Telehealth: Payer: Self-pay | Admitting: *Deleted

## 2019-05-02 DIAGNOSIS — E7800 Pure hypercholesterolemia, unspecified: Secondary | ICD-10-CM

## 2019-05-02 NOTE — Telephone Encounter (Signed)
-----   Message from Regina Eck, CNM sent at 05/01/2019  7:52 AM EDT ----- Notify patient her cholesterol is 227 up from 208, Triglycerides better at 128 from 215, HDL is 43 from 45, LDL is 161 which has increased. Overall profile risk is increased to 5.3.Marland KitchenNeeds referral to PCP for management. Please assist her with this. She needs to continue to work on good diet and exercise.

## 2019-05-02 NOTE — Telephone Encounter (Signed)
Patient returned call and notified of results. Verified understanding. RN advised would place referral to PCP for patient. Advised referral coordinator would be in touch to help get appointment with PCP scheduled. Patient agreeable.   Routing to provider and will close encounter.

## 2019-05-02 NOTE — Telephone Encounter (Signed)
Message left to return call to Triage Nurse at [phone].    

## 2019-06-28 ENCOUNTER — Other Ambulatory Visit: Payer: Self-pay | Admitting: Certified Nurse Midwife

## 2019-06-28 DIAGNOSIS — Z1231 Encounter for screening mammogram for malignant neoplasm of breast: Secondary | ICD-10-CM

## 2019-08-22 ENCOUNTER — Ambulatory Visit
Admission: RE | Admit: 2019-08-22 | Discharge: 2019-08-22 | Disposition: A | Discharge status: Home / Self Care | Payer: No Typology Code available for payment source | Source: Ambulatory Visit | Attending: Certified Nurse Midwife | Admitting: Certified Nurse Midwife

## 2019-08-22 ENCOUNTER — Other Ambulatory Visit: Payer: Self-pay

## 2019-08-22 DIAGNOSIS — Z1231 Encounter for screening mammogram for malignant neoplasm of breast: Secondary | ICD-10-CM

## 2019-11-02 ENCOUNTER — Encounter: Payer: Self-pay | Admitting: Certified Nurse Midwife

## 2020-04-07 ENCOUNTER — Other Ambulatory Visit: Payer: Self-pay

## 2020-04-07 DIAGNOSIS — Z3040 Encounter for surveillance of contraceptives, unspecified: Secondary | ICD-10-CM

## 2020-04-07 MED ORDER — NORETHIN ACE-ETH ESTRAD-FE 1-20 MG-MCG PO TABS: 1.0000 | ORAL_TABLET | Freq: Every day | ORAL | 0 refills | Status: DC

## 2020-04-07 NOTE — Telephone Encounter (Signed)
Medication refill request: Aurovela FE 1/20  Last AEX:  01/24/19 Next AEX: 07/04/20 Last MMG (if hormonal medication request): 08/22/19 NEG  Refill authorized: 84/0

## 2020-06-27 ENCOUNTER — Other Ambulatory Visit: Payer: Self-pay | Admitting: Obstetrics and Gynecology

## 2020-06-27 DIAGNOSIS — Z3040 Encounter for surveillance of contraceptives, unspecified: Secondary | ICD-10-CM

## 2020-06-27 NOTE — Telephone Encounter (Signed)
Medication refill request: Aurovela FE 1/20  Last AEX:  01/24/19 Next AEX: 08/06/20 Last MMG (if hormonal medication request): 08/22/19  Neg  Refill authorized: 84/0

## 2020-07-04 ENCOUNTER — Ambulatory Visit: Payer: No Typology Code available for payment source | Admitting: Obstetrics and Gynecology

## 2020-07-18 ENCOUNTER — Other Ambulatory Visit: Payer: Self-pay | Admitting: Obstetrics and Gynecology

## 2020-07-18 DIAGNOSIS — Z0000 Encounter for general adult medical examination without abnormal findings: Secondary | ICD-10-CM

## 2020-08-05 NOTE — Progress Notes (Signed)
49 y.o. [id number] Single Black or African American Not Hispanic or Latino female here for annual exam.  Patient is being follow by PCP for blood pressure, started on medication for HTN since her last visit.  She is having the breakthrough bleeding still with her pills.  Menses q month x 5-6 days. She can saturate a super tampon in 3-4 hours at times. Severe dysmenorrhea. She is having BTB every month. Spots for an entire week in the middle of the pack.   Sexually active, same partner x 23 years, live together. No dyspareunia.   Reviewed ultrasound report from 2018 Biopsy was benign, polypoid  BM 1-2 x a week, she has always been like this. Takes stool softener intermittently, occasionally takes a laxative.     Patient's last menstrual period was 07/16/2020 (approximate).          Sexually active: Yes.    The current method of family planning is OCP (estrogen/progesterone).    Exercising: No.  The patient does not participate in regular exercise at present. Smoker:  no  Health Maintenance: Pap:  01-11-18 neg HPV HR neg, 12-03-14 neg HPV HR neg History of abnormal Pap:  Yes, f/u was normal.  MMG:  08/22/19 density B Bi-rads 1 neg  BMD:   None  Colonoscopy: none  TDaP:  2018  Gardasil: N/A   reports that she has never smoked. She has never used smokeless tobacco. She reports that she does not drink alcohol and does not use drugs. 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  Daughters are 28 and 51. Oldest is in [place]. 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   Past Medical History:  Diagnosis Date  . Abnormal pap about 2004  . HSV infection    Asymptomatic    Past Surgical History:  Procedure Laterality Date  . CESAREAN SECTION  04/24/2003  . WISDOM TOOTH EXTRACTION  early 20's    Current Outpatient Medications  Medication Sig Dispense Refill  . amLODipine (NORVASC) 5 MG tablet 1 tablet    . AUROVELA FE 1/20 1-20 MG-MCG tablet TAKE 1 TABLET BY  MOUTH DAILY 84 tablet 0  . cetirizine (ZYRTEC) 10 MG tablet Take 10 mg by mouth as needed for allergies.    . cholecalciferol (VITAMIN D) 1000 UNITS tablet Take 1,000 Units by mouth as needed.      No current facility-administered medications for this visit.    Family History  Problem Relation Age of Onset  . Diabetes Mother   . Hypertension Mother   . Heart disease Mother   . COPD Mother   . Diabetes Father   . Diabetes Maternal Grandmother   . Hypertension Maternal Grandmother   . Heart disease Maternal Grandmother   . Heart failure Maternal Grandfather   . Heart disease Maternal Grandfather   . Colon cancer Paternal Uncle 30  Brother died at 84, heart failure, cirrhosis Mom died at 51 suddenly, multiple medical issues.  She has 2 other sisters that she grew up with and a half brother and half sister.   Review of Systems  Genitourinary: Positive for vaginal bleeding.  All other systems reviewed and are negative.   Exam:   BP (!) 150/80   Pulse 97   Ht 5\' 7"  (1.702 m)   Wt 233 lb 6.4 oz (105.9 kg)   LMP 07/16/2020 (Approximate)   SpO2 100%   BMI 36.56 kg/m   Weight change: @WEIGHTCHANGE @ Height:  Height: 5\' 7"  (170.2 cm)  Ht Readings from Last 3 Encounters:  08/06/20 5\' 7"  (1.702 m)  01/24/19 5' 6.75" (1.695 m)  01/11/18 5' 7.25" (1.708 m)    General appearance: alert, cooperative and appears stated age Head: Normocephalic, without obvious abnormality, atraumatic Neck: no adenopathy, supple, symmetrical, trachea midline and thyroid normal to inspection and palpation Lungs: clear to auscultation bilaterally Cardiovascular: regular rate and rhythm Breasts: normal appearance, no masses or tenderness Abdomen: soft, non-tender; non distended,  no masses,  no organomegaly Extremities: extremities normal, atraumatic, no cyanosis or edema Skin: Skin color, texture, turgor normal. No rashes or lesions Lymph nodes: Cervical, supraclavicular, and axillary nodes normal. No  abnormal inguinal nodes palpated Neurologic: Grossly normal   Pelvic: External genitalia:  no lesions              Urethra:  normal appearing urethra with no masses, tenderness or lesions              Bartholins and Skenes: normal                 Vagina: normal appearing vagina with normal color, slight increase in watery vaginal d/c (patient denies symptoms)              Cervix: no cervical motion tenderness, no lesions and friable               Bimanual Exam:  Uterus:  anteverted, mobile, 8 week sized, not tender              Adnexa: no mass, fullness, tenderness               Rectovaginal: Confirms               Anus:  normal sphincter tone, no lesions  Terence Lux chaperoned for the exam.  A:  Well Woman with normal exam  AUB on OCP's with BTB  Friable cervix  H/O fibroids and possible endometrial polyps  On OCP's, needs to change contraception given HTN  Severe dysmenorrhea, using OTC ibuprofen (declines script)  P:   Pap with hpv/GC/CT/Trich  CBC, TSH  Other labs with primary  Change to micronor  Return for ultrasound, possible sonohysterogram  IFOB  Mammogram is scheduled  Discussed breast self exam  Discussed calcium and vit D intake

## 2020-08-06 ENCOUNTER — Ambulatory Visit: Payer: 59 | Admitting: Obstetrics and Gynecology

## 2020-08-06 ENCOUNTER — Other Ambulatory Visit: Payer: Self-pay

## 2020-08-06 ENCOUNTER — Other Ambulatory Visit (HOSPITAL_COMMUNITY)
Admission: RE | Admit: 2020-08-06 | Discharge: 2020-08-06 | Disposition: A | Discharge status: Home / Self Care | Payer: 59 | Source: Ambulatory Visit | Attending: Obstetrics and Gynecology | Admitting: Obstetrics and Gynecology

## 2020-08-06 ENCOUNTER — Encounter: Payer: Self-pay | Admitting: Obstetrics and Gynecology

## 2020-08-06 VITALS — BP 150/80 | HR 97 | Ht 67.0 in | Wt 233.4 lb

## 2020-08-06 DIAGNOSIS — Z01419 Encounter for gynecological examination (general) (routine) without abnormal findings: Secondary | ICD-10-CM | POA: Diagnosis not present

## 2020-08-06 DIAGNOSIS — E785 Hyperlipidemia, unspecified: Secondary | ICD-10-CM | POA: Insufficient documentation

## 2020-08-06 DIAGNOSIS — I10 Essential (primary) hypertension: Secondary | ICD-10-CM | POA: Insufficient documentation

## 2020-08-06 DIAGNOSIS — N888 Other specified noninflammatory disorders of cervix uteri: Secondary | ICD-10-CM

## 2020-08-06 DIAGNOSIS — Z124 Encounter for screening for malignant neoplasm of cervix: Secondary | ICD-10-CM | POA: Insufficient documentation

## 2020-08-06 DIAGNOSIS — E559 Vitamin D deficiency, unspecified: Secondary | ICD-10-CM | POA: Insufficient documentation

## 2020-08-06 DIAGNOSIS — D252 Subserosal leiomyoma of uterus: Secondary | ICD-10-CM

## 2020-08-06 DIAGNOSIS — N939 Abnormal uterine and vaginal bleeding, unspecified: Secondary | ICD-10-CM | POA: Diagnosis present

## 2020-08-06 DIAGNOSIS — Z3009 Encounter for other general counseling and advice on contraception: Secondary | ICD-10-CM

## 2020-08-06 DIAGNOSIS — N921 Excessive and frequent menstruation with irregular cycle: Secondary | ICD-10-CM | POA: Insufficient documentation

## 2020-08-06 DIAGNOSIS — D251 Intramural leiomyoma of uterus: Secondary | ICD-10-CM

## 2020-08-06 DIAGNOSIS — N946 Dysmenorrhea, unspecified: Secondary | ICD-10-CM

## 2020-08-06 DIAGNOSIS — R739 Hyperglycemia, unspecified: Secondary | ICD-10-CM | POA: Insufficient documentation

## 2020-08-06 DIAGNOSIS — Z1211 Encounter for screening for malignant neoplasm of colon: Secondary | ICD-10-CM

## 2020-08-06 MED ORDER — NORETHINDRONE 0.35 MG PO TABS: 1.0000 | ORAL_TABLET | Freq: Every day | ORAL | 3 refills | Status: DC

## 2020-08-06 NOTE — Patient Instructions (Signed)

## 2020-08-07 LAB — CBC
Hematocrit: 37.5 % (ref 34.0–46.6)
Hemoglobin: 12.2 g/dL (ref 11.1–15.9)
MCH: 27.1 pg (ref 26.6–33.0)
MCHC: 32.5 g/dL (ref 31.5–35.7)
MCV: 83 fL (ref 79–97)
Platelets: 300 10*3/uL (ref 150–450)
RBC: 4.51 x10E6/uL (ref 3.77–5.28)
RDW: 13.1 % (ref 11.7–15.4)
WBC: 8.1 10*3/uL (ref 3.4–10.8)

## 2020-08-07 LAB — TSH: TSH: 1.31 u[IU]/mL (ref 0.450–4.500)

## 2020-08-12 LAB — FECAL OCCULT BLOOD, IMMUNOCHEMICAL: Fecal Occult Bld: NEGATIVE

## 2020-08-14 LAB — CYTOLOGY - PAP
Chlamydia: NEGATIVE
Comment: NEGATIVE
Comment: NEGATIVE
Comment: NORMAL
Diagnosis: NEGATIVE
Diagnosis: REACTIVE
Neisseria Gonorrhea: NEGATIVE
Trichomonas: NEGATIVE

## 2020-09-03 ENCOUNTER — Ambulatory Visit
Admission: RE | Admit: 2020-09-03 | Discharge: 2020-09-03 | Disposition: A | Discharge status: Home / Self Care | Payer: 59 | Source: Ambulatory Visit | Attending: Obstetrics and Gynecology | Admitting: Obstetrics and Gynecology

## 2020-09-03 ENCOUNTER — Other Ambulatory Visit: Payer: Self-pay

## 2020-09-03 DIAGNOSIS — Z0000 Encounter for general adult medical examination without abnormal findings: Secondary | ICD-10-CM

## 2020-09-04 ENCOUNTER — Other Ambulatory Visit: Payer: Self-pay | Admitting: Obstetrics and Gynecology

## 2020-09-04 DIAGNOSIS — R928 Other abnormal and inconclusive findings on diagnostic imaging of breast: Secondary | ICD-10-CM

## 2020-09-17 ENCOUNTER — Ambulatory Visit: Payer: 59

## 2020-09-17 ENCOUNTER — Other Ambulatory Visit: Payer: Self-pay

## 2020-09-17 ENCOUNTER — Encounter: Payer: Self-pay | Admitting: Obstetrics and Gynecology

## 2020-09-17 ENCOUNTER — Ambulatory Visit
Admission: RE | Admit: 2020-09-17 | Discharge: 2020-09-17 | Disposition: A | Discharge status: Home / Self Care | Payer: 59 | Source: Ambulatory Visit | Attending: Obstetrics and Gynecology | Admitting: Obstetrics and Gynecology

## 2020-09-17 DIAGNOSIS — R928 Other abnormal and inconclusive findings on diagnostic imaging of breast: Secondary | ICD-10-CM

## 2020-09-19 ENCOUNTER — Other Ambulatory Visit: Payer: Self-pay | Admitting: *Deleted

## 2020-09-19 DIAGNOSIS — N939 Abnormal uterine and vaginal bleeding, unspecified: Secondary | ICD-10-CM

## 2020-09-19 NOTE — Telephone Encounter (Signed)
New [facility] order placed.

## 2020-09-19 NOTE — Telephone Encounter (Signed)
Left message for patient to call and schedule shgm.

## 2020-09-22 ENCOUNTER — Other Ambulatory Visit: Payer: Self-pay | Admitting: Obstetrics and Gynecology

## 2020-09-22 DIAGNOSIS — Z3040 Encounter for surveillance of contraceptives, unspecified: Secondary | ICD-10-CM

## 2020-09-25 ENCOUNTER — Other Ambulatory Visit: Payer: Self-pay | Admitting: Obstetrics and Gynecology

## 2020-09-25 ENCOUNTER — Ambulatory Visit (INDEPENDENT_AMBULATORY_CARE_PROVIDER_SITE_OTHER): Payer: 59 | Admitting: Obstetrics and Gynecology

## 2020-09-25 ENCOUNTER — Ambulatory Visit (INDEPENDENT_AMBULATORY_CARE_PROVIDER_SITE_OTHER): Payer: 59

## 2020-09-25 ENCOUNTER — Other Ambulatory Visit: Payer: Self-pay

## 2020-09-25 ENCOUNTER — Telehealth: Payer: Self-pay

## 2020-09-25 ENCOUNTER — Encounter: Payer: Self-pay | Admitting: Obstetrics and Gynecology

## 2020-09-25 VITALS — BP 110/64 | HR 85 | Ht 67.0 in | Wt 234.0 lb

## 2020-09-25 DIAGNOSIS — D252 Subserosal leiomyoma of uterus: Secondary | ICD-10-CM

## 2020-09-25 DIAGNOSIS — N939 Abnormal uterine and vaginal bleeding, unspecified: Secondary | ICD-10-CM | POA: Diagnosis not present

## 2020-09-25 DIAGNOSIS — D251 Intramural leiomyoma of uterus: Secondary | ICD-10-CM | POA: Diagnosis not present

## 2020-09-25 DIAGNOSIS — R7303 Prediabetes: Secondary | ICD-10-CM

## 2020-09-25 DIAGNOSIS — Z3009 Encounter for other general counseling and advice on contraception: Secondary | ICD-10-CM

## 2020-09-25 DIAGNOSIS — N840 Polyp of corpus uteri: Secondary | ICD-10-CM | POA: Diagnosis not present

## 2020-09-25 NOTE — H&P (View-Only) (Signed)
GYNECOLOGY  VISIT   HPI: 50 y.o.   Single Black or African American Not Hispanic or Latino  female   [phone] with No LMP recorded.   here for evaluation of AUB   She was evaluated for AUB in 2018, sonohysterogram with biopsy with multiple intramural and subserosal myomas and 2 likely endometrial polyps. Endometrial biopsy benign with polypoid endometrium. The patient declined surgical removal at that time. She has continued to have BTB on OCP's. At her annual exam in 12/21 she was switched from OCP's to POP secondary to recent diagnosis of HTN.   She was noted to have a friable cervix at her exam in 12/21. Pap from 08/06/20 was negative with negative GC/CT/Trich.  GYNECOLOGIC HISTORY: No LMP recorded. Contraception:POP Menopausal hormone therapy: no        OB History    Gravida  3   Para  2   Term  2   Preterm  0   AB  1   Living  2     SAB  0   IAB  0   Ectopic  0   Multiple  0   Live Births  2              Patient Active Problem List   Diagnosis Date Noted  . Prediabetes   . Hyperglycemia 08/06/2020  . Hyperlipidemia, unspecified 08/06/2020  . Hypertension 08/06/2020  . Morbid obesity ([place]) 08/06/2020  . Vitamin D deficiency 08/06/2020  . Intramural and subserous leiomyoma of uterus 01/23/2017  . Abnormal uterine bleeding (AUB) 01/23/2017  . Endometrial polyp 12/20/2013    Past Medical History:  Diagnosis Date  . Abnormal pap about 2004  . HSV infection    Asymptomatic  . Prediabetes     Past Surgical History:  Procedure Laterality Date  . CESAREAN SECTION  04/24/2003  . WISDOM TOOTH EXTRACTION  early 20's    Current Outpatient Medications  Medication Sig Dispense Refill  . amLODipine (NORVASC) 5 MG tablet 1 tablet    . cetirizine (ZYRTEC) 10 MG tablet Take 10 mg by mouth as needed for allergies.    . cholecalciferol (VITAMIN D) 1000 UNITS tablet Take 1,000 Units by mouth as needed.     . norethindrone (MICRONOR) 0.35 MG tablet Take 1 tablet  (0.35 mg total) by mouth daily. 84 tablet 3   No current facility-administered medications for this visit.     ALLERGIES: Patient has no known allergies.  Family History  Problem Relation Age of Onset  . Diabetes Mother   . Hypertension Mother   . Heart disease Mother   . COPD Mother   . Diabetes Father   . Diabetes Maternal Grandmother   . Hypertension Maternal Grandmother   . Heart disease Maternal Grandmother   . Heart failure Maternal Grandfather   . Heart disease Maternal Grandfather   . Colon cancer Paternal Uncle 56    Social History   Socioeconomic History  . Marital status: Single    Spouse name: Not on file  . Number of children: Not on file  . Years of education: Not on file  . Highest education level: Not on file  Occupational History  . Not on file  Tobacco Use  . Smoking status: Never Smoker  . Smokeless tobacco: Never Used  Vaping Use  . Vaping Use: Never used  Substance and Sexual Activity  . Alcohol use: No  . Drug use: No  . Sexual activity: Yes    Partners: Male  Birth control/protection: Pill  Other Topics Concern  . Not on file  Social History Narrative  . Not on file   Social Determinants of Health   Financial Resource Strain: Not on file  Food Insecurity: Not on file  Transportation Needs: Not on file  Physical Activity: Not on file  Stress: Not on file  Social Connections: Not on file  Intimate Partner Violence: Not on file    ROS  PHYSICAL EXAMINATION:    BP 110/64   Pulse 85   Ht 5\' 7"  (1.702 m)   Wt 234 lb (106.1 kg)   SpO2 100%   BMI 36.65 kg/m     General appearance: alert, cooperative and appears stated age Neck: no adenopathy, supple, symmetrical, trachea midline and thyroid normal to inspection and palpation Heart: regular rate and rhythm Lungs: CTAB Abdomen: soft, non-tender; bowel sounds normal; no masses,  no organomegaly Extremities: normal, atraumatic, no cyanosis Skin: normal color, texture and turgor,  no rashes or lesions Lymph: normal cervical supraclavicular and inguinal nodes Neurologic: grossly normal    Pelvic: External genitalia:  no lesions              Urethra:  normal appearing urethra with no masses, tenderness or lesions              Bartholins and Skenes: normal                 Vagina: normal appearing vagina with normal color and discharge, no lesions              Cervix: no lesions  Sonohysterogram The procedure and risks of the procedure were reviewed with the patient, consent form was signed.  Pelvic ultrasound  Indications: Intermenstrual bleeding  Findings:  Uterus 12.22 x 9.46 x 8.79 cm   Multiple myomas:  1) 2.87 x 2.53 cm  2) 1.78 x 2.02 cm  3) 0.88 x 0.61 cm  4) 4.59 x 5.68 cm  5) 1.58 x 1.73 cm  6) 3.48 x 2.4 cm  Endometrium 6.66 mm  Left ovary 3.57 x 1.86 x 1.35 cm  Right ovary 4.03 x 2.47 x 2.25 cm  2.81 x 2.14 cm avascular, echo free cyst c/w a follicle.   No free fluid   A speculum was placed in the vagina and the cervix was cleansed with betadine. The sonohysterogram catheter was inserted into the uterine cavity without difficulty. Saline was infused under direct observation with the ultrasound. A 1.8 x 0.6 cm filling defect was noted. There were no fibroids impinging on the cavity.The catheter was removed.    Impression:  Enlarged anteverted uterus with multiple fibroids, largest 4.6 x 5.7 cm Visualization of the endometrium limited by fibroids. Simple right ovarian cyst 2.8 cm x 2.1 cm No adnexal masses Sonohystergram with 1.8 cm filling defect c/w an endometrial polyp     Chaperone was present for exam.  1. Abnormal uterine bleeding (AUB) Sonohysterogram with endometrial polyp  2. Intramural and subserous leiomyoma of uterus No intracavitary component.   3. Endometrial polyp Plan: hysteroscopy, polypectomy, dilation and curettage, and mirena IUD insertion. Reviewed risks, including: bleeding, infection, uterine  perforation, fluid overload, need for further sugery  ACOG handouts given  4. General counseling and advice on female contraception Mirena IUD insertion in the OR, then she will stop the micronor.   In addition to the sonohysterogram and review of the results, over 10 minutes was spent discussing management.

## 2020-09-25 NOTE — Telephone Encounter (Signed)
-----   Message from Salvadore Dom, MD sent at 09/25/2020  3:02 PM EST ----- Warden Fillers, Please schedule this patient for a hysteroscopy, polypectomy, D&C, mirena IUD insertion (contraception) Sharee Pimple  CcAngie Fava

## 2020-09-25 NOTE — Progress Notes (Signed)
GYNECOLOGY  VISIT   HPI: 50 y.o.   Single Black or African American Not Hispanic or Latino  female   [phone] with No LMP recorded.   here for evaluation of AUB   She was evaluated for AUB in 2018, sonohysterogram with biopsy with multiple intramural and subserosal myomas and 2 likely endometrial polyps. Endometrial biopsy benign with polypoid endometrium. The patient declined surgical removal at that time. She has continued to have BTB on OCP's. At her annual exam in 12/21 she was switched from OCP's to POP secondary to recent diagnosis of HTN.   She was noted to have a friable cervix at her exam in 12/21. Pap from 08/06/20 was negative with negative GC/CT/Trich.  GYNECOLOGIC HISTORY: No LMP recorded. Contraception:POP Menopausal hormone therapy: no        OB History    Gravida  3   Para  2   Term  2   Preterm  0   AB  1   Living  2     SAB  0   IAB  0   Ectopic  0   Multiple  0   Live Births  2              Patient Active Problem List   Diagnosis Date Noted  . Prediabetes   . Hyperglycemia 08/06/2020  . Hyperlipidemia, unspecified 08/06/2020  . Hypertension 08/06/2020  . Morbid obesity ([place]) 08/06/2020  . Vitamin D deficiency 08/06/2020  . Intramural and subserous leiomyoma of uterus 01/23/2017  . Abnormal uterine bleeding (AUB) 01/23/2017  . Endometrial polyp 12/20/2013    Past Medical History:  Diagnosis Date  . Abnormal pap about 2004  . HSV infection    Asymptomatic  . Prediabetes     Past Surgical History:  Procedure Laterality Date  . CESAREAN SECTION  04/24/2003  . WISDOM TOOTH EXTRACTION  early 20's    Current Outpatient Medications  Medication Sig Dispense Refill  . amLODipine (NORVASC) 5 MG tablet 1 tablet    . cetirizine (ZYRTEC) 10 MG tablet Take 10 mg by mouth as needed for allergies.    . cholecalciferol (VITAMIN D) 1000 UNITS tablet Take 1,000 Units by mouth as needed.     . norethindrone (MICRONOR) 0.35 MG tablet Take 1 tablet  (0.35 mg total) by mouth daily. 84 tablet 3   No current facility-administered medications for this visit.     ALLERGIES: Patient has no known allergies.  Family History  Problem Relation Age of Onset  . Diabetes Mother   . Hypertension Mother   . Heart disease Mother   . COPD Mother   . Diabetes Father   . Diabetes Maternal Grandmother   . Hypertension Maternal Grandmother   . Heart disease Maternal Grandmother   . Heart failure Maternal Grandfather   . Heart disease Maternal Grandfather   . Colon cancer Paternal Uncle 24    Social History   Socioeconomic History  . Marital status: Single    Spouse name: Not on file  . Number of children: Not on file  . Years of education: Not on file  . Highest education level: Not on file  Occupational History  . Not on file  Tobacco Use  . Smoking status: Never Smoker  . Smokeless tobacco: Never Used  Vaping Use  . Vaping Use: Never used  Substance and Sexual Activity  . Alcohol use: No  . Drug use: No  . Sexual activity: Yes    Partners: Male  Birth control/protection: Pill  Other Topics Concern  . Not on file  Social History Narrative  . Not on file   Social Determinants of Health   Financial Resource Strain: Not on file  Food Insecurity: Not on file  Transportation Needs: Not on file  Physical Activity: Not on file  Stress: Not on file  Social Connections: Not on file  Intimate Partner Violence: Not on file    ROS  PHYSICAL EXAMINATION:    BP 110/64   Pulse 85   Ht 5\' 7"  (1.702 m)   Wt 234 lb (106.1 kg)   SpO2 100%   BMI 36.65 kg/m     General appearance: alert, cooperative and appears stated age Neck: no adenopathy, supple, symmetrical, trachea midline and thyroid normal to inspection and palpation Heart: regular rate and rhythm Lungs: CTAB Abdomen: soft, non-tender; bowel sounds normal; no masses,  no organomegaly Extremities: normal, atraumatic, no cyanosis Skin: normal color, texture and turgor,  no rashes or lesions Lymph: normal cervical supraclavicular and inguinal nodes Neurologic: grossly normal    Pelvic: External genitalia:  no lesions              Urethra:  normal appearing urethra with no masses, tenderness or lesions              Bartholins and Skenes: normal                 Vagina: normal appearing vagina with normal color and discharge, no lesions              Cervix: no lesions  Sonohysterogram The procedure and risks of the procedure were reviewed with the patient, consent form was signed.  Pelvic ultrasound  Indications: Intermenstrual bleeding  Findings:  Uterus 12.22 x 9.46 x 8.79 cm   Multiple myomas:  1) 2.87 x 2.53 cm  2) 1.78 x 2.02 cm  3) 0.88 x 0.61 cm  4) 4.59 x 5.68 cm  5) 1.58 x 1.73 cm  6) 3.48 x 2.4 cm  Endometrium 6.66 mm  Left ovary 3.57 x 1.86 x 1.35 cm  Right ovary 4.03 x 2.47 x 2.25 cm  2.81 x 2.14 cm avascular, echo free cyst c/w a follicle.   No free fluid   A speculum was placed in the vagina and the cervix was cleansed with betadine. The sonohysterogram catheter was inserted into the uterine cavity without difficulty. Saline was infused under direct observation with the ultrasound. A 1.8 x 0.6 cm filling defect was noted. There were no fibroids impinging on the cavity.The catheter was removed.    Impression:  Enlarged anteverted uterus with multiple fibroids, largest 4.6 x 5.7 cm Visualization of the endometrium limited by fibroids. Simple right ovarian cyst 2.8 cm x 2.1 cm No adnexal masses Sonohystergram with 1.8 cm filling defect c/w an endometrial polyp     Chaperone was present for exam.  1. Abnormal uterine bleeding (AUB) Sonohysterogram with endometrial polyp  2. Intramural and subserous leiomyoma of uterus No intracavitary component.   3. Endometrial polyp Plan: hysteroscopy, polypectomy, dilation and curettage, and mirena IUD insertion. Reviewed risks, including: bleeding, infection, uterine  perforation, fluid overload, need for further sugery  ACOG handouts given  4. General counseling and advice on female contraception Mirena IUD insertion in the OR, then she will stop the micronor.   In addition to the sonohysterogram and review of the results, over 10 minutes was spent discussing management.

## 2020-09-26 ENCOUNTER — Encounter: Payer: Self-pay | Admitting: Obstetrics and Gynecology

## 2020-09-26 NOTE — Telephone Encounter (Addendum)
Spoke with patient. Reviewed potential surgery dates  Will proceed with scheduling on 10/07/20 at [facility].  Advised patient once scheduled, will return call to confirm.  Patient agreeable.

## 2020-09-26 NOTE — Telephone Encounter (Signed)
Spoke with patient regarding surgery benefits. Patient acknowledges understanding of information presented. Patient is aware that benefits presented are professional benefits only. Patient is aware that once surgery is scheduled, the hospital will call with separate benefits. See account note.  Routing to Jill Hamm, RN, for surgery scheduling. 

## 2020-09-26 NOTE — Telephone Encounter (Signed)
Left message to call Sharee Pimple, RN at [facility], [phone].  Call to confirm surgery date and review pre-op instructions.

## 2020-09-29 NOTE — Telephone Encounter (Signed)
Spoke with patient. Surgery date request confirmed.  Advised surgery is scheduled for 10/07/20 at [address], [facility].  Surgery instruction sheet and hospital brochure reviewed, printed copy will be mailed to address on file.  Patient advised of Covid screening and quarantine requirements and agreeable.   Routing to provider. Encounter closed.

## 2020-10-02 ENCOUNTER — Other Ambulatory Visit: Payer: Self-pay

## 2020-10-02 ENCOUNTER — Encounter (HOSPITAL_BASED_OUTPATIENT_CLINIC_OR_DEPARTMENT_OTHER): Payer: Self-pay | Admitting: Obstetrics and Gynecology

## 2020-10-02 NOTE — Progress Notes (Signed)
Spoke w/ via phone for pre-op interview---pt Lab needs dos----   Urine poct, I stat ekg            Lab results------none COVID test ------10-03-2020 215 pm Arrive at -------730 am 10-07-2020 NPO after MN NO Solid Food.  Clear liquids from MN until---630 am then npo Medications to take morning of surgery -----amlodipine, norethidrone Diabetic medication -----n/a Patient Special Instructions -----none Pre-Op special Istructions -----none Patient verbalized understanding of instructions that were given at this phone interview. Patient denies shortness of breath, chest pain, fever, cough at this phone interview.

## 2020-10-03 ENCOUNTER — Other Ambulatory Visit (HOSPITAL_COMMUNITY)
Admission: RE | Admit: 2020-10-03 | Discharge: 2020-10-03 | Disposition: A | Discharge status: Home / Self Care | Payer: 59 | Source: Ambulatory Visit | Attending: Obstetrics and Gynecology | Admitting: Obstetrics and Gynecology

## 2020-10-03 DIAGNOSIS — Z20822 Contact with and (suspected) exposure to covid-19: Secondary | ICD-10-CM | POA: Diagnosis not present

## 2020-10-03 DIAGNOSIS — Z01812 Encounter for preprocedural laboratory examination: Secondary | ICD-10-CM | POA: Diagnosis present

## 2020-10-04 LAB — SARS CORONAVIRUS 2 (TAT 6-24 HRS): SARS Coronavirus 2: NEGATIVE

## 2020-10-06 ENCOUNTER — Telehealth: Payer: Self-pay | Admitting: *Deleted

## 2020-10-06 NOTE — Telephone Encounter (Signed)
Spoke with patient. Surgery date request confirmed.  Advised surgery is scheduled for 10/14/20 at 1130, [facility].  Patient advised of Covid screening and quarantine requirements and agreeable.   Routing to provider. Encounter closed.   Cc: Hayley Carder

## 2020-10-06 NOTE — Telephone Encounter (Signed)
Spoke with patient.  PA still pending for surgery scheduled for 10/07/20. Recommended surgery be rescheduled. Patient agreeable. Surgery to be r/s to 10/14/20.  Advised patient I will return call to confirm once rescheduled.  Patient verbalizes understanding.

## 2020-10-08 NOTE — Progress Notes (Signed)
Spoke w/ via phone for pre-op interview---pt Lab needs dos----   Urine poct, I stat ekg            Lab results------none COVID test ------[id number] Arrive at -------930 am 10-14-2020 NPO after MN NO Solid Food.  Clear liquids from MN until---830 am then npo Medications to take morning of surgery -----amlodipine, norethidrone Diabetic medication -----n/a Patient Special Instructions -----none Pre-Op special Istructions -----none Patient verbalized understanding of instructions that were given at this phone interview. Patient denies shortness of breath, chest pain, fever, cough at this phone interview.  Spoke with patient by phone, patient stated no changes to medcal history done 10-02-2020

## 2020-10-10 ENCOUNTER — Other Ambulatory Visit (HOSPITAL_COMMUNITY)
Admission: RE | Admit: 2020-10-10 | Discharge: 2020-10-10 | Disposition: A | Discharge status: Home / Self Care | Payer: 59 | Source: Ambulatory Visit | Attending: Obstetrics and Gynecology | Admitting: Obstetrics and Gynecology

## 2020-10-10 DIAGNOSIS — Z20822 Contact with and (suspected) exposure to covid-19: Secondary | ICD-10-CM | POA: Insufficient documentation

## 2020-10-10 DIAGNOSIS — Z01812 Encounter for preprocedural laboratory examination: Secondary | ICD-10-CM | POA: Insufficient documentation

## 2020-10-10 LAB — SARS CORONAVIRUS 2 (TAT 6-24 HRS): SARS Coronavirus 2: NEGATIVE

## 2020-10-14 ENCOUNTER — Encounter (HOSPITAL_BASED_OUTPATIENT_CLINIC_OR_DEPARTMENT_OTHER): Payer: Self-pay | Admitting: Obstetrics and Gynecology

## 2020-10-14 ENCOUNTER — Ambulatory Visit (HOSPITAL_BASED_OUTPATIENT_CLINIC_OR_DEPARTMENT_OTHER)
Admission: RE | Admit: 2020-10-14 | Discharge: 2020-10-14 | Disposition: A | Discharge status: Home / Self Care | Payer: 59 | Attending: Obstetrics and Gynecology | Admitting: Obstetrics and Gynecology

## 2020-10-14 ENCOUNTER — Encounter (HOSPITAL_BASED_OUTPATIENT_CLINIC_OR_DEPARTMENT_OTHER)
Admission: RE | Disposition: A | Discharge status: Home / Self Care | Payer: Self-pay | Source: Home / Self Care | Attending: Obstetrics and Gynecology

## 2020-10-14 ENCOUNTER — Ambulatory Visit (HOSPITAL_BASED_OUTPATIENT_CLINIC_OR_DEPARTMENT_OTHER): Payer: 59 | Admitting: Anesthesiology

## 2020-10-14 DIAGNOSIS — N939 Abnormal uterine and vaginal bleeding, unspecified: Secondary | ICD-10-CM | POA: Insufficient documentation

## 2020-10-14 DIAGNOSIS — N840 Polyp of corpus uteri: Secondary | ICD-10-CM | POA: Diagnosis not present

## 2020-10-14 DIAGNOSIS — I10 Essential (primary) hypertension: Secondary | ICD-10-CM | POA: Insufficient documentation

## 2020-10-14 DIAGNOSIS — Z30430 Encounter for insertion of intrauterine contraceptive device: Secondary | ICD-10-CM | POA: Diagnosis not present

## 2020-10-14 DIAGNOSIS — D251 Intramural leiomyoma of uterus: Secondary | ICD-10-CM | POA: Diagnosis not present

## 2020-10-14 DIAGNOSIS — Z3049 Encounter for surveillance of other contraceptives: Secondary | ICD-10-CM | POA: Diagnosis not present

## 2020-10-14 DIAGNOSIS — Z793 Long term (current) use of hormonal contraceptives: Secondary | ICD-10-CM | POA: Insufficient documentation

## 2020-10-14 HISTORY — DX: Essential (primary) hypertension: I10

## 2020-10-14 HISTORY — DX: Abnormal uterine and vaginal bleeding, unspecified: N93.9

## 2020-10-14 HISTORY — PX: INTRAUTERINE DEVICE (IUD) INSERTION: SHX5877

## 2020-10-14 HISTORY — DX: Presence of spectacles and contact lenses: Z97.3

## 2020-10-14 HISTORY — PX: HYSTEROSCOPY WITH D & C: SHX1775

## 2020-10-14 HISTORY — DX: Prediabetes: R73.03

## 2020-10-14 LAB — CBC
HCT: 41.3 % (ref 36.0–46.0)
Hemoglobin: 13.4 g/dL (ref 12.0–15.0)
MCH: 27.8 pg (ref 26.0–34.0)
MCHC: 32.4 g/dL (ref 30.0–36.0)
MCV: 85.7 fL (ref 80.0–100.0)
Platelets: 277 10*3/uL (ref 150–400)
RBC: 4.82 MIL/uL (ref 3.87–5.11)
RDW: 12.9 % (ref 11.5–15.5)
WBC: 6.5 10*3/uL (ref 4.0–10.5)
nRBC: 0 % (ref 0.0–0.2)

## 2020-10-14 LAB — COMPREHENSIVE METABOLIC PANEL
ALT: 15 U/L (ref 0–44)
AST: 21 U/L (ref 15–41)
Albumin: 4.5 g/dL (ref 3.5–5.0)
Alkaline Phosphatase: 66 U/L (ref 38–126)
Anion gap: 12 (ref 5–15)
BUN: 13 mg/dL (ref 6–20)
CO2: 22 mmol/L (ref 22–32)
Calcium: 9.4 mg/dL (ref 8.9–10.3)
Chloride: 105 mmol/L (ref 98–111)
Creatinine, Ser: 0.76 mg/dL (ref 0.44–1.00)
GFR, Estimated: >60 mL/min (ref >60)
Glucose, Bld: 107 mg/dL — ABNORMAL HIGH (ref 70–99)
Potassium: 3.6 mmol/L (ref 3.5–5.1)
Sodium: 139 mmol/L (ref 135–145)
Total Bilirubin: 0.8 mg/dL (ref 0.3–1.2)
Total Protein: 8.4 g/dL — ABNORMAL HIGH (ref 6.5–8.1)

## 2020-10-14 LAB — POCT PREGNANCY, URINE: Preg Test, Ur: NEGATIVE

## 2020-10-14 SURGERY — DILATATION AND CURETTAGE /HYSTEROSCOPY
Anesthesia: General | Site: Uterus

## 2020-10-14 MED ORDER — LIDOCAINE HCL (PF) 2 % IJ SOLN
INTRAMUSCULAR | Status: AC
  Filled 2020-10-14: qty 5

## 2020-10-14 MED ORDER — SCOPOLAMINE 1 MG/3DAYS TD PT72
MEDICATED_PATCH | TRANSDERMAL | Status: AC
  Filled 2020-10-14: qty 1

## 2020-10-14 MED ORDER — ONDANSETRON HCL 4 MG/2ML IJ SOLN
INTRAMUSCULAR | Status: AC
  Filled 2020-10-14: qty 2

## 2020-10-14 MED ORDER — FENTANYL CITRATE (PF) 100 MCG/2ML IJ SOLN
25.0000 ug | INTRAMUSCULAR | Status: DC | PRN
Start: 2020-10-14 — End: 2020-10-14

## 2020-10-14 MED ORDER — DEXAMETHASONE SODIUM PHOSPHATE 10 MG/ML IJ SOLN
INTRAMUSCULAR | Status: AC
  Filled 2020-10-14: qty 1

## 2020-10-14 MED ORDER — FENTANYL CITRATE (PF) 100 MCG/2ML IJ SOLN
INTRAMUSCULAR | Status: DC | PRN
  Administered 2020-10-14 (×2): 50 ug via INTRAVENOUS

## 2020-10-14 MED ORDER — OXYCODONE HCL 5 MG/5ML PO SOLN: 5.0000 mg | Freq: Once | ORAL | Status: DC | PRN

## 2020-10-14 MED ORDER — SODIUM CHLORIDE 0.9 % IR SOLN
Status: DC | PRN
  Administered 2020-10-14: 3000 mL

## 2020-10-14 MED ORDER — POVIDONE-IODINE 10 % EX SWAB: 2.0000 "application " | Freq: Once | CUTANEOUS | Status: DC

## 2020-10-14 MED ORDER — FENTANYL CITRATE (PF) 100 MCG/2ML IJ SOLN
INTRAMUSCULAR | Status: AC
  Filled 2020-10-14: qty 2

## 2020-10-14 MED ORDER — ACETAMINOPHEN 500 MG PO TABS
1000.0000 mg | ORAL_TABLET | ORAL | Status: AC
  Administered 2020-10-14: 1000 mg via ORAL

## 2020-10-14 MED ORDER — SCOPOLAMINE 1 MG/3DAYS TD PT72
1.0000 | MEDICATED_PATCH | TRANSDERMAL | Status: DC
  Administered 2020-10-14: 1.5 mg via TRANSDERMAL

## 2020-10-14 MED ORDER — MIDAZOLAM HCL 5 MG/5ML IJ SOLN
INTRAMUSCULAR | Status: DC | PRN
  Administered 2020-10-14: 2 mg via INTRAVENOUS

## 2020-10-14 MED ORDER — MIDAZOLAM HCL 2 MG/2ML IJ SOLN: 0.5000 mg | Freq: Once | INTRAMUSCULAR | Status: DC | PRN

## 2020-10-14 MED ORDER — LEVONORGESTREL 20 MCG/24HR IU IUD
INTRAUTERINE_SYSTEM | INTRAUTERINE | Status: AC
  Filled 2020-10-14: qty 1

## 2020-10-14 MED ORDER — LIDOCAINE HCL (PF) 2 % IJ SOLN
INTRAMUSCULAR | Status: AC
  Filled 2020-10-14: qty 15

## 2020-10-14 MED ORDER — ONDANSETRON HCL 4 MG/2ML IJ SOLN
INTRAMUSCULAR | Status: AC
  Filled 2020-10-14: qty 4

## 2020-10-14 MED ORDER — PROPOFOL 10 MG/ML IV BOLUS
INTRAVENOUS | Status: AC
  Filled 2020-10-14: qty 20

## 2020-10-14 MED ORDER — MIDAZOLAM HCL 2 MG/2ML IJ SOLN
INTRAMUSCULAR | Status: AC
  Filled 2020-10-14: qty 2

## 2020-10-14 MED ORDER — ACETAMINOPHEN 500 MG PO TABS
ORAL_TABLET | ORAL | Status: AC
  Filled 2020-10-14: qty 2

## 2020-10-14 MED ORDER — SILVER NITRATE-POT NITRATE 75-25 % EX MISC
CUTANEOUS | Status: DC | PRN
  Administered 2020-10-14: 2

## 2020-10-14 MED ORDER — LEVONORGESTREL 20 MCG/24HR IU IUD
INTRAUTERINE_SYSTEM | INTRAUTERINE | Status: AC
  Administered 2020-10-14: 1 via INTRAUTERINE

## 2020-10-14 MED ORDER — LIDOCAINE 2% (20 MG/ML) 5 ML SYRINGE
INTRAMUSCULAR | Status: DC | PRN
  Administered 2020-10-14: 60 mg via INTRAVENOUS

## 2020-10-14 MED ORDER — KETOROLAC TROMETHAMINE 30 MG/ML IJ SOLN
INTRAMUSCULAR | Status: AC
  Filled 2020-10-14: qty 1

## 2020-10-14 MED ORDER — OXYCODONE HCL 5 MG PO TABS: 5.0000 mg | ORAL_TABLET | Freq: Once | ORAL | Status: DC | PRN

## 2020-10-14 MED ORDER — PROMETHAZINE HCL 25 MG/ML IJ SOLN: 6.2500 mg | INTRAMUSCULAR | Status: DC | PRN

## 2020-10-14 MED ORDER — LACTATED RINGERS IV SOLN: INTRAVENOUS | Status: DC

## 2020-10-14 MED ORDER — KETOROLAC TROMETHAMINE 30 MG/ML IJ SOLN
INTRAMUSCULAR | Status: DC | PRN
  Administered 2020-10-14: 30 mg via INTRAVENOUS

## 2020-10-14 MED ORDER — DEXAMETHASONE SODIUM PHOSPHATE 10 MG/ML IJ SOLN
INTRAMUSCULAR | Status: DC | PRN
  Administered 2020-10-14: 5 mg via INTRAVENOUS

## 2020-10-14 MED ORDER — ONDANSETRON HCL 4 MG/2ML IJ SOLN
INTRAMUSCULAR | Status: DC | PRN
  Administered 2020-10-14: 4 mg via INTRAVENOUS

## 2020-10-14 MED ORDER — MEPERIDINE HCL 25 MG/ML IJ SOLN: 6.2500 mg | INTRAMUSCULAR | Status: DC | PRN

## 2020-10-14 MED ORDER — PROPOFOL 10 MG/ML IV BOLUS
INTRAVENOUS | Status: DC | PRN
  Administered 2020-10-14: 200 mg via INTRAVENOUS

## 2020-10-14 MED ORDER — ACETAMINOPHEN 500 MG PO TABS: 1000.0000 mg | ORAL_TABLET | Freq: Once | ORAL | Status: DC

## 2020-10-14 SURGICAL SUPPLY — 16 items
CANISTER SUCT 3000ML PPV (MISCELLANEOUS) ×4 IMPLANT
CATH ROBINSON RED A/P 16FR (CATHETERS) IMPLANT
COVER WAND RF STERILE (DRAPES) ×2 IMPLANT
DEVICE MYOSURE LITE (MISCELLANEOUS) ×2 IMPLANT
DILATOR CANAL MILEX (MISCELLANEOUS) IMPLANT
GAUZE 4X4 16PLY RFD (DISPOSABLE) ×2 IMPLANT
GLOVE SURG ENC MOIS LTX SZ6.5 (GLOVE) ×2 IMPLANT
GOWN STRL REUS W/TWL LRG LVL3 (GOWN DISPOSABLE) ×2 IMPLANT
KIT PROCEDURE FLUENT (KITS) ×2 IMPLANT
KIT TURNOVER CYSTO (KITS) ×2 IMPLANT
PACK VAGINAL MINOR WOMEN LF (CUSTOM PROCEDURE TRAY) ×2 IMPLANT
PAD OB MATERNITY 4.3X12.25 (PERSONAL CARE ITEMS) ×2 IMPLANT
PAD PREP 24X48 CUFFED NSTRL (MISCELLANEOUS) ×2 IMPLANT
TOWEL OR 17X26 10 PK STRL BLUE (TOWEL DISPOSABLE) ×4 IMPLANT
WATER STERILE IRR 500ML POUR (IV SOLUTION) IMPLANT
mirena ×2 IMPLANT

## 2020-10-14 NOTE — Discharge Instructions (Signed)

## 2020-10-14 NOTE — Anesthesia Postprocedure Evaluation (Signed)
Anesthesia Post Note  Patient: [facility]  Procedure(s) Performed: DILATATION AND CURETTAGE /HYSTEROSCOPY WITH MYOSURE (N/A Uterus) INTRAUTERINE DEVICE (IUD) INSERTION (N/A Uterus)     Patient location during evaluation: Phase II Anesthesia Type: General Level of consciousness: awake and alert, oriented and patient cooperative Pain management: pain level controlled Vital Signs Assessment: post-procedure vital signs reviewed and stable Respiratory status: spontaneous breathing, nonlabored ventilation and respiratory function stable Cardiovascular status: blood pressure returned to baseline and stable Postop Assessment: no apparent nausea or vomiting, adequate PO intake and able to ambulate Anesthetic complications: no   No complications documented.  Last Vitals:  Vitals:   10/14/20 1100 10/14/20 1115  BP: (!) 154/92 (!) 164/89  Pulse: 92 73  Resp: 16 16  Temp:    SpO2: 100% 100%    Last Pain:  Vitals:   10/14/20 1130  PainSc: 0-No pain                 Hilario Robarts,E. Tanish Prien

## 2020-10-14 NOTE — Anesthesia Procedure Notes (Signed)
Procedure Name: LMA Insertion Date/Time: 10/14/2020 10:16 AM Performed by: Rogers Blocker, CRNA Pre-anesthesia Checklist: Patient identified, Emergency Drugs available, Suction available and Patient being monitored Patient Re-evaluated:Patient Re-evaluated prior to induction Oxygen Delivery Method: Circle System Utilized Preoxygenation: Pre-oxygenation with 100% oxygen Induction Type: IV induction Ventilation: Mask ventilation without difficulty LMA: LMA inserted LMA Size: 4.0 Number of attempts: 1 Placement Confirmation: positive ETCO2 Tube secured with: Tape Dental Injury: Teeth and Oropharynx as per pre-operative assessment

## 2020-10-14 NOTE — Op Note (Addendum)
Preoperative Diagnosis: Abnormal uterine bleeding, fibroid uterus, endometrial polyp  Postoperative Diagnosis: same  Procedure: Hysteroscopy, resection of polyp and myoma, dilation and curettage  Surgeon: Dr Sumner Boast  Assistants: None  Anesthesia: General via LMA  EBL: 5 cc  Fluids: 600 cc LR  Fluid deficit: 190 cc  Urine output: not recorded  Indications for surgery: The patient is a 50 yo female with a known fibroid uterus presented with abnormal uterine bleeding. Work up included a normal pap smear, an ultrasound with multiple intramural myomas, sonohysterogram with an filling defect consistent with an endometrial polyp. No fibroids were noted to be impinging on the cavity.  The risks of the surgery were reviewed with the patient and the consent form was signed prior to her surgery.  Findings: EUA: enlarged, irregular, anteverted uterus, no adnexal masses. Hysteroscopy: endometrial polyp, 2 small lesions suspicious for myomas, slight deviation of the anterior uterine wall by a myoma.   Specimens: endometrial polyp and myoma, endometrial curettings.   Procedure: The patient was taken to the operating room with an IV in place. She was placed in the dorsal lithotomy position and anesthesia was administered. She was prepped and draped in the usual sterile fashion for a vaginal procedure. She voided on the way to the OR. A weighted speculum was placed in the vagina and a single tooth tenaculum was placed on the anterior lip of the cervix. The cervix was dilated to a #21 pratt dilator. The uterus was sounded to 11 cm. The myosure hysteroscope was inserted into the uterine cavity. With continuous infusion of normal saline, the uterine cavity was visualized with the above findings. The myosure light was used to remove all of the intracavitary lesions and the portion of the anterior myoma that was deviating the cavity. There was no bleeding with the resection. The intrauterine pressure was  dropped to 50 and there was no further expulsion of the myoma. The myosure was then removed. The cavity was then curetted with the small sharp curette. The cavity had the characteristically gritty texture at the end of the procedure. The curette was removed and the mirena IUD was inserted in the standard fashion. The strings were cut to 3-4 cm. The single tooth tenaculum were removed. Oozing from the tenaculum site was stopped with pressure and silver nitrate. The speculum was removed. The patients perineum was cleansed of betadine and she was taken out of the dorsal lithotomy position.  Upon awakening the LMA was removed and the patient was transferred to the recovery room in stable and awake condition.  The sponge and instrument count were correct. There were no complications.   Addendum: Procedure should include Mirena IUD insertion.

## 2020-10-14 NOTE — Interval H&P Note (Signed)
History and Physical Interval Note:  10/14/2020 10:02 AM  Sarah Keller  has presented today for surgery, with the diagnosis of Abnormal Uterine Bleeding, Endometrial Polyp, General counseling and advice on femal contraception.  The various methods of treatment have been discussed with the patient and family. After consideration of risks, benefits and other options for treatment, the patient has consented to  Procedure(s) with comments: DILATATION AND CURETTAGE /HYSTEROSCOPY (N/A) INTRAUTERINE DEVICE (IUD) INSERTION (N/A) - Mirena IUD insertion as a surgical intervention.  The patient's history has been reviewed, patient examined, no change in status, stable for surgery.  I have reviewed the patient's chart and labs.  Questions were answered to the patient's satisfaction.     Salvadore Dom

## 2020-10-14 NOTE — Transfer of Care (Signed)
Immediate Anesthesia Transfer of Care Note  Patient: Sarah Keller  Procedure(s) Performed: DILATATION AND CURETTAGE /HYSTEROSCOPY WITH MYOSURE (N/A Uterus) INTRAUTERINE DEVICE (IUD) INSERTION (N/A Uterus)  Patient Location: PACU  Anesthesia Type:General  Level of Consciousness: awake, alert , oriented and patient cooperative  Airway & Oxygen Therapy: Patient Spontanous Breathing  Post-op Assessment: Report given to RN and Post -op Vital signs reviewed and stable  Post vital signs: Reviewed and stable  Last Vitals:  Vitals Value Taken Time  BP    Temp 36.5 C 10/14/20 1052  Pulse 95 10/14/20 1053  Resp 17 10/14/20 1053  SpO2 99 % 10/14/20 1053  Vitals shown include unvalidated device data.  Last Pain:  Vitals:   10/14/20 0953  PainSc: 0-No pain      Patients Stated Pain Goal: 5 (88/30/14 1597)  Complications: No complications documented.

## 2020-10-14 NOTE — Anesthesia Preprocedure Evaluation (Addendum)
Anesthesia Evaluation  Patient identified by MRN, date of birth, ID band Patient awake    Reviewed: Allergy & Precautions, NPO status , Patient's Chart, lab work & pertinent test results  History of Anesthesia Complications Negative for: history of anesthetic complications  Airway Mallampati: II  TM Distance: >3 FB Neck ROM: Full    Dental  (+) Teeth Intact, Dental Advisory Given   Pulmonary  10/10/2020 SARS Coronavirus NEG   breath sounds clear to auscultation       Cardiovascular hypertension, Pt. on medications (-) angina Rhythm:Regular Rate:Normal     Neuro/Psych negative neurological ROS     GI/Hepatic negative GI ROS, Neg liver ROS,   Endo/Other  Morbid obesity  Renal/GU negative Renal ROS     Musculoskeletal   Abdominal (+) + obese,   Peds  Hematology negative hematology ROS (+)   Anesthesia Other Findings   Reproductive/Obstetrics                            Anesthesia Physical Anesthesia Plan  ASA: II  Anesthesia Plan: General   Post-op Pain Management:    Induction: Intravenous  PONV Risk Score and Plan: 3 and Ondansetron, Dexamethasone and Scopolamine patch - Pre-op  Airway Management Planned: LMA  Additional Equipment: None  Intra-op Plan:   Post-operative Plan:   Informed Consent: I have reviewed the patients History and Physical, chart, labs and discussed the procedure including the risks, benefits and alternatives for the proposed anesthesia with the patient or authorized representative who has indicated his/her understanding and acceptance.     Dental advisory given  Plan Discussed with: CRNA and Surgeon  Anesthesia Plan Comments:        Anesthesia Quick Evaluation

## 2020-10-15 ENCOUNTER — Encounter (HOSPITAL_BASED_OUTPATIENT_CLINIC_OR_DEPARTMENT_OTHER): Payer: Self-pay | Admitting: Obstetrics and Gynecology

## 2020-10-15 LAB — SURGICAL PATHOLOGY

## 2020-10-23 ENCOUNTER — Ambulatory Visit: Payer: Self-pay | Admitting: Obstetrics and Gynecology

## 2020-10-29 ENCOUNTER — Ambulatory Visit: Payer: Self-pay | Admitting: Obstetrics and Gynecology

## 2020-10-29 ENCOUNTER — Ambulatory Visit (INDEPENDENT_AMBULATORY_CARE_PROVIDER_SITE_OTHER): Payer: 59 | Admitting: Obstetrics and Gynecology

## 2020-10-29 ENCOUNTER — Encounter: Payer: Self-pay | Admitting: Obstetrics and Gynecology

## 2020-10-29 ENCOUNTER — Other Ambulatory Visit: Payer: Self-pay

## 2020-10-29 VITALS — BP 130/74 | HR 90 | Ht 67.0 in | Wt 230.0 lb

## 2020-10-29 DIAGNOSIS — Z9889 Other specified postprocedural states: Secondary | ICD-10-CM

## 2020-10-29 DIAGNOSIS — Z30431 Encounter for routine checking of intrauterine contraceptive device: Secondary | ICD-10-CM

## 2020-10-29 NOTE — Progress Notes (Signed)
GYNECOLOGY  VISIT   HPI: 50 y.o.   Single Black or African American Not Hispanic or Latino  female   [phone] with Patient's last menstrual period was 10/20/2020.   here for a two week post op D&C. Patient states that she is feeling fine. No problems. She is s/p hysteroscopy, polypectomy, myomectomy (small), mirena IUD insertion. Pathology was benign. Cavity sounded to 11 cm (fibroid uterus).  GYNECOLOGIC HISTORY: Patient's last menstrual period was 10/20/2020. Contraception:IUD Menopausal hormone therapy: none         OB History    Gravida  3   Para  2   Term  2   Preterm  0   AB  1   Living  2     SAB  0   IAB  0   Ectopic  0   Multiple  0   Live Births  2              Patient Active Problem List   Diagnosis Date Noted  . Prediabetes   . Hyperglycemia 08/06/2020  . Hyperlipidemia, unspecified 08/06/2020  . Hypertension 08/06/2020  . Morbid obesity ([place]) 08/06/2020  . Vitamin D deficiency 08/06/2020  . Intramural and subserous leiomyoma of uterus 01/23/2017  . Abnormal uterine bleeding (AUB) 01/23/2017  . Endometrial polyp 12/20/2013    Past Medical History:  Diagnosis Date  . Abnormal pap about 2004  . Abnormal uterine bleeding (AUB)   . Concussion 2001   no residual  . HSV infection    Asymptomatic  . Hypertension   . Pre-diabetes   . Prediabetes   . Wears glasses     Past Surgical History:  Procedure Laterality Date  . CESAREAN SECTION  04/24/2003  . HYSTEROSCOPY WITH D & C N/A 10/14/2020   Procedure: DILATATION AND CURETTAGE /HYSTEROSCOPY WITH MYOSURE;  Surgeon: Salvadore Dom, MD;  Location: [facility];  Service: Gynecology;  Laterality: N/A;  . INTRAUTERINE DEVICE (IUD) INSERTION N/A 10/14/2020   Procedure: INTRAUTERINE DEVICE (IUD) INSERTION;  Surgeon: Salvadore Dom, MD;  Location: [place];  Service: Gynecology;  Laterality: N/A;  Mirena IUD insertion  . WISDOM TOOTH EXTRACTION  early 20's     Current Outpatient Medications  Medication Sig Dispense Refill  . acetaminophen (TYLENOL) 500 MG tablet Take 1,000 mg by mouth every 6 (six) hours as needed.    Marland Kitchen amLODipine (NORVASC) 5 MG tablet 1 tablet    . cetirizine (ZYRTEC) 10 MG tablet Take 10 mg by mouth at bedtime.    . cholecalciferol (VITAMIN D) 1000 UNITS tablet Take 1,000 Units by mouth as needed.     Marland Kitchen levonorgestrel (MIRENA, 52 MG,) 20 MCG/24HR IUD See admin instructions.     No current facility-administered medications for this visit.     ALLERGIES: Patient has no known allergies.  Family History  Problem Relation Age of Onset  . Diabetes Mother   . Hypertension Mother   . Heart disease Mother   . COPD Mother   . Diabetes Father   . Diabetes Maternal Grandmother   . Hypertension Maternal Grandmother   . Heart disease Maternal Grandmother   . Heart failure Maternal Grandfather   . Heart disease Maternal Grandfather   . Colon cancer Paternal Uncle 71    Social History   Socioeconomic History  . Marital status: Single    Spouse name: Not on file  . Number of children: Not on file  . Years of education: Not on file  .  Highest education level: Not on file  Occupational History  . Not on file  Tobacco Use  . Smoking status: Never Smoker  . Smokeless tobacco: Never Used  Vaping Use  . Vaping Use: Never used  Substance and Sexual Activity  . Alcohol use: No  . Drug use: No  . Sexual activity: Yes    Partners: Male    Birth control/protection: Pill  Other Topics Concern  . Not on file  Social History Narrative  . Not on file   Social Determinants of Health   Financial Resource Strain: Not on file  Food Insecurity: Not on file  Transportation Needs: Not on file  Physical Activity: Not on file  Stress: Not on file  Social Connections: Not on file  Intimate Partner Violence: Not on file    Review of Systems  All other systems reviewed and are negative.   PHYSICAL EXAMINATION:    BP  130/74   Pulse 90   Ht 5\' 7"  (1.702 m)   Wt 230 lb (104.3 kg)   LMP 10/20/2020   SpO2 99%   BMI 36.02 kg/m     General appearance: alert, cooperative and appears stated age  Pelvic: External genitalia:  no lesions              Urethra:  normal appearing urethra with no masses, tenderness or lesions              Bartholins and Skenes: normal                 Vagina: normal appearing vagina with normal color and discharge, no lesions              Cervix: no lesions and IUD string 4 cm              Bimanual Exam:  Uterus:  anterverted, mobile, 10-12 week sized, not tender              Adnexa: no mass, fullness, tenderness                Chaperone was present for exam.  1. Status post hysteroscopic myomectomy/polypectomy Doing well Routine f/u  2. IUD check up Doing well Discussed that her cavity is 11 cm and that the mirena is approved for contraception for cavities up to 10 cm. Effectiveness may be slightly decreased.

## 2021-05-08 IMAGING — MG DIGITAL SCREENING BILAT W/ CAD
4 series · 4 of 4 positions shown · non-contrast
Comparison: Previous exam(s).

CLINICAL DATA: Screening.

EXAM:
DIGITAL SCREENING BILATERAL MAMMOGRAM WITH CAD

[R CC]
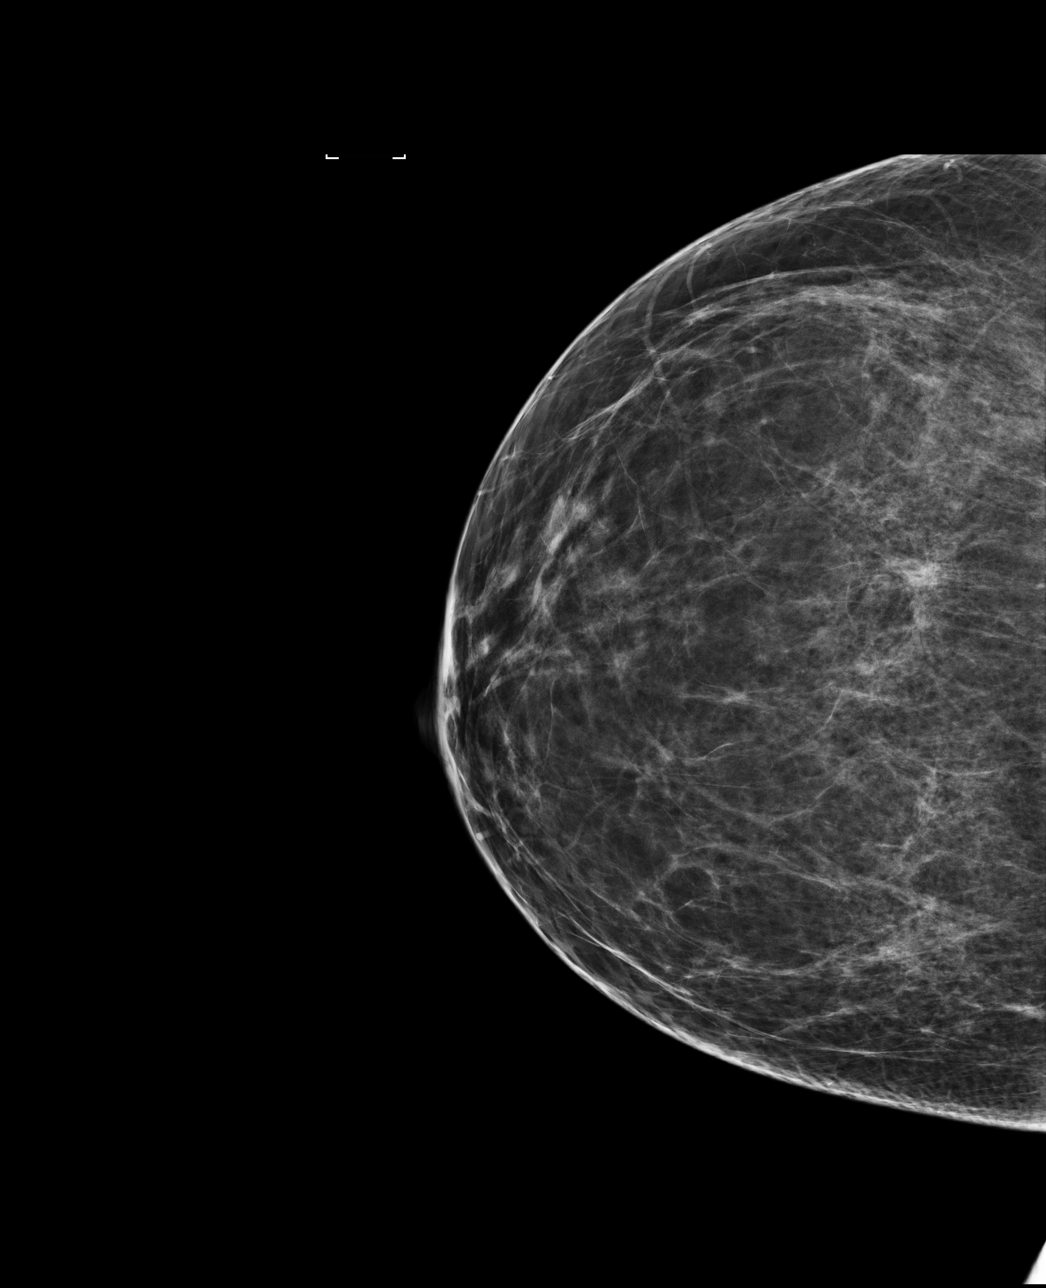

[L CC]
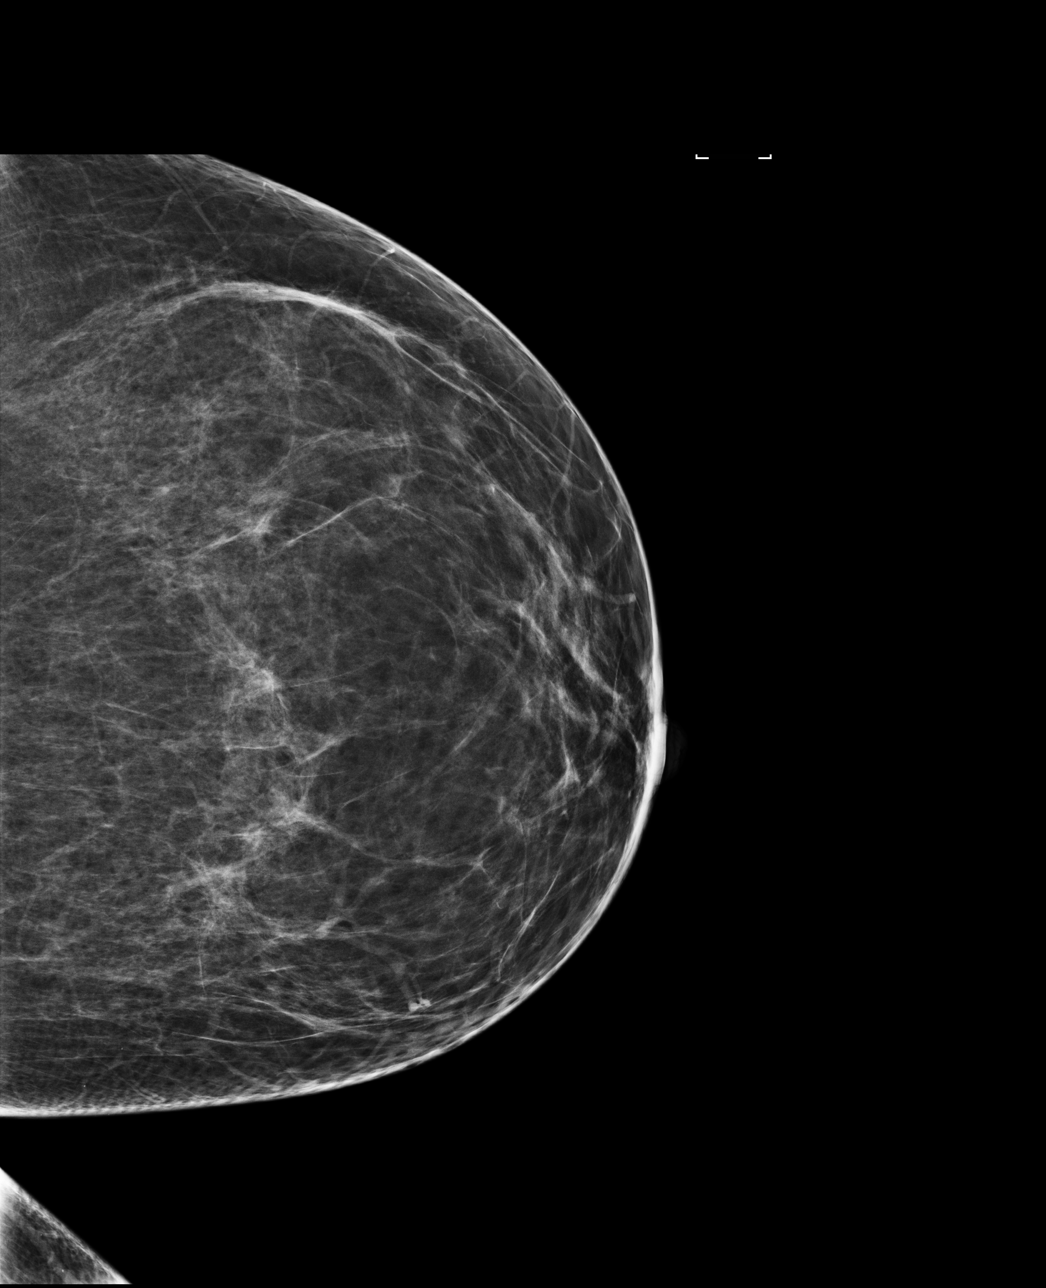

[L MLO]
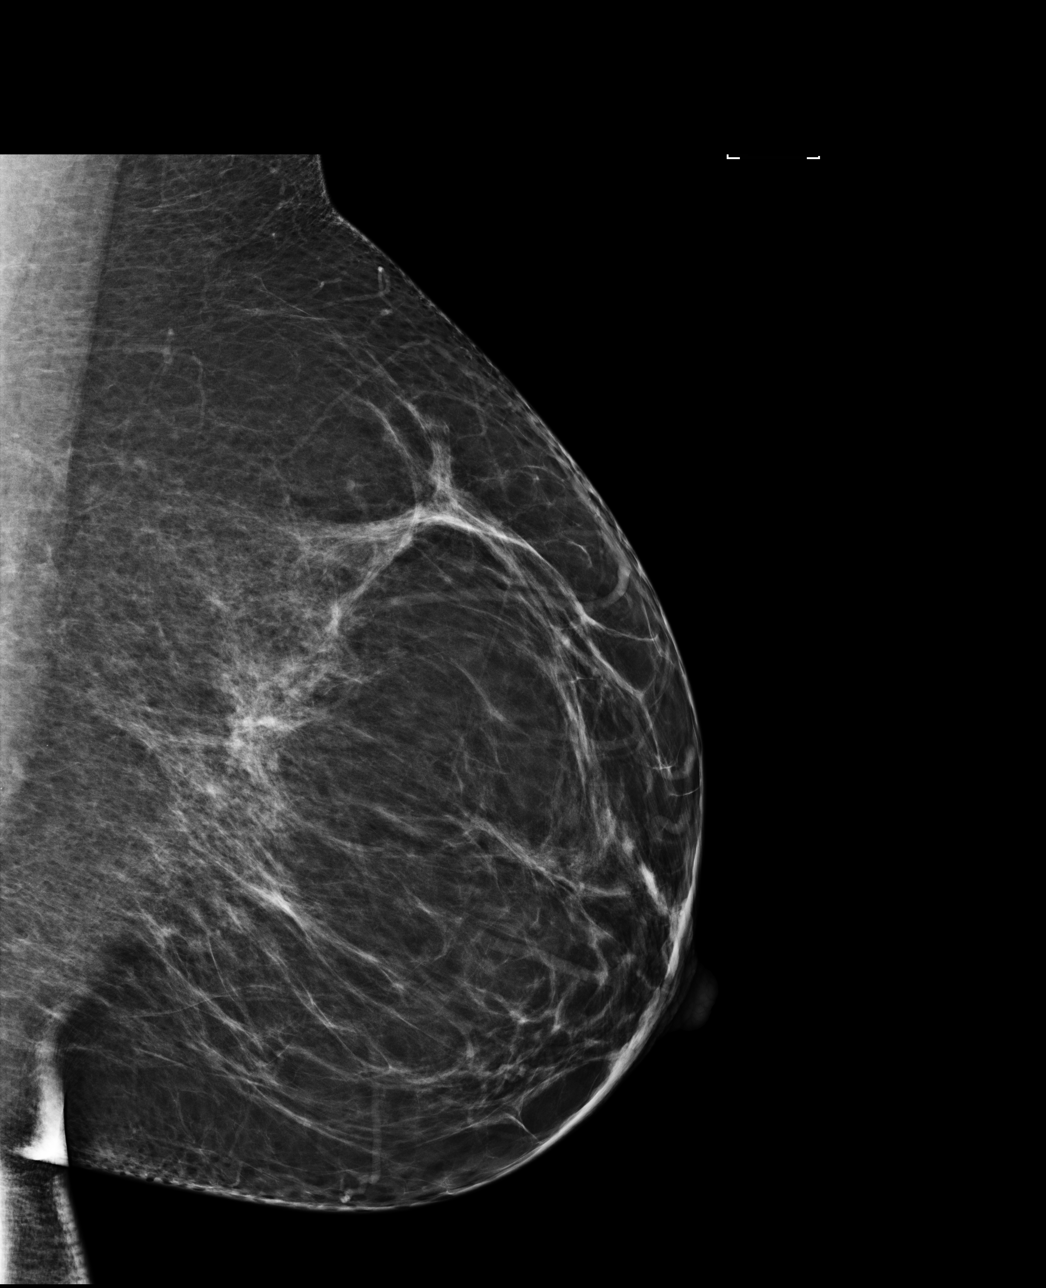

[R MLO]
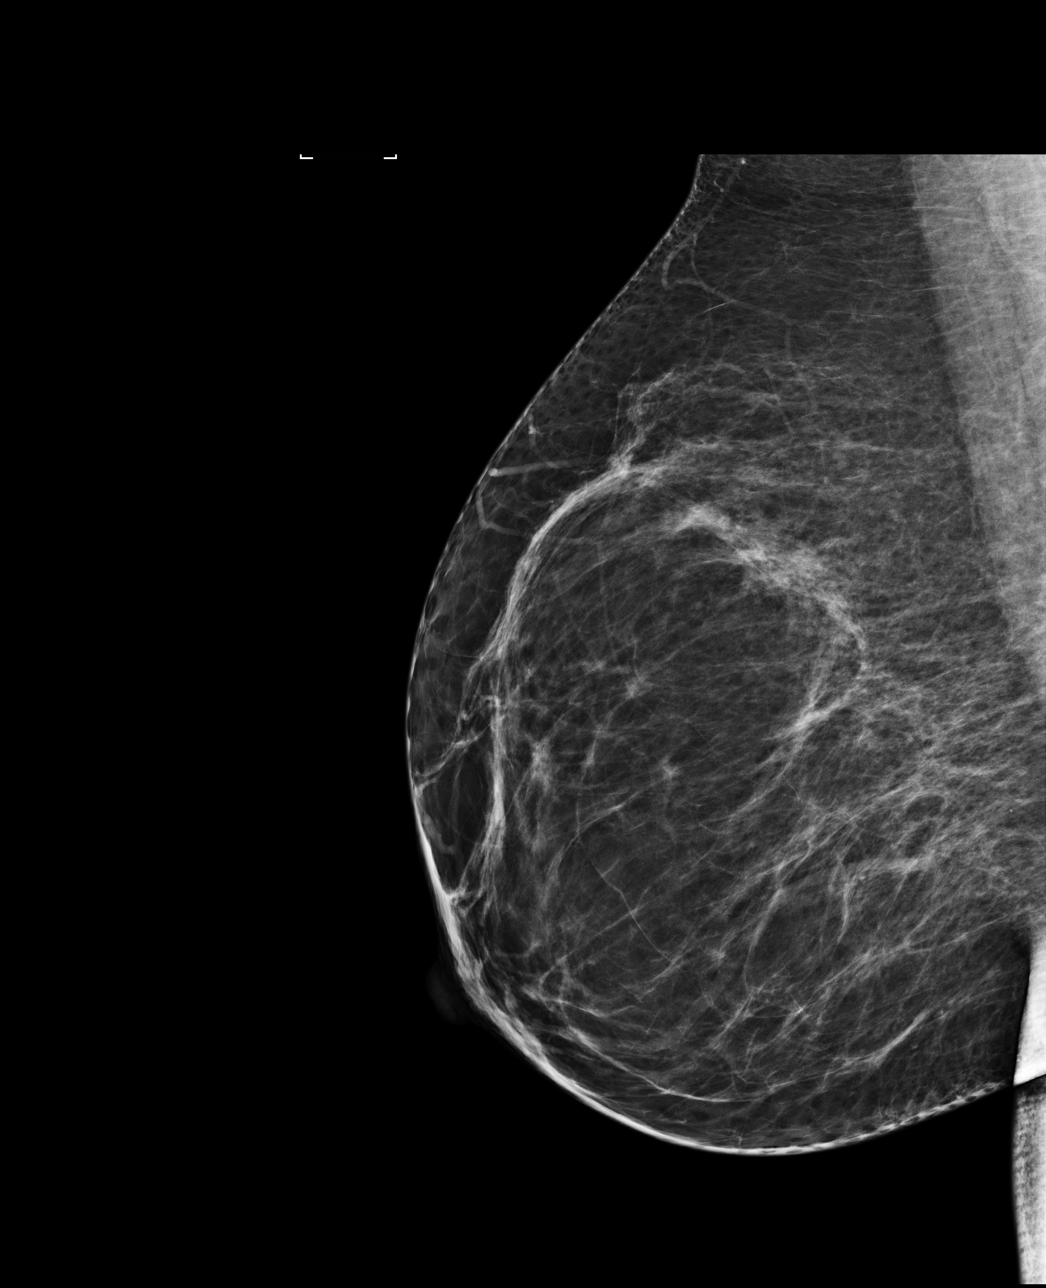

[4 of 4 positions shown; findings below may reference images not displayed]

ACR Breast Density Category b: There are scattered areas of
fibroglandular density.
FINDINGS: In the right breast, a possible asymmetry warrants further
evaluation. In the left breast, no findings suspicious for
malignancy. Images were processed with CAD.
IMPRESSION: Further evaluation is suggested for possible asymmetry in the right
breast.

RECOMMENDATION:
Diagnostic mammogram and possibly ultrasound of the right breast.
(Code:[id number])

The patient will be contacted regarding the findings, and additional
imaging will be scheduled.

BI-RADS CATEGORY  0: Incomplete. Need additional imaging evaluation
and/or prior mammograms for comparison.

## 2021-08-19 ENCOUNTER — Other Ambulatory Visit: Payer: Self-pay | Admitting: Physician Assistant

## 2021-08-19 ENCOUNTER — Other Ambulatory Visit: Payer: Self-pay | Admitting: Family Medicine

## 2021-08-19 DIAGNOSIS — Z1231 Encounter for screening mammogram for malignant neoplasm of breast: Secondary | ICD-10-CM

## 2021-08-20 DIAGNOSIS — R350 Frequency of micturition: Secondary | ICD-10-CM | POA: Diagnosis not present

## 2021-08-20 DIAGNOSIS — R300 Dysuria: Secondary | ICD-10-CM | POA: Diagnosis not present

## 2021-08-20 DIAGNOSIS — N3000 Acute cystitis without hematuria: Secondary | ICD-10-CM | POA: Diagnosis not present

## 2021-08-20 NOTE — Progress Notes (Signed)
51 y.o. [id number] Single Black or African American Not Hispanic or Latino female here for annual exam.  She states that she missed her November period.  10/14/20 hysteroscopic myomectomy, polypectomy, D&C and mirena IUD insertion. Cycles have been irregular, can have a cycle in 2-8 weeks (only one skipped cycle). Lighter than prior to the IUD. She has some spotting after straining with a BM (h/o friable cervix). Has only ~2 BM's a week (no change). Period Duration (Days): 6 Period Pattern: (!) Irregular Menstrual Flow: Moderate Menstrual Control: Tampon, Maxi pad Menstrual Control Change Freq (Hours): 4 Dysmenorrhea: (!) Mild Dysmenorrhea Symptoms: Cramping  Sexually active, same partner x 25 years, live together. No dyspareunia.   No bladder issues.   She has tolerable vasomotor symptoms, started about a year ago.   Patient's last menstrual period was 07/31/2021.          Sexually active: Yes.    The current method of family planning is IUD, mirena inserted 3/22.  Exercising: No.  The patient does not participate in regular exercise at present. Smoker:  no  Health Maintenance: Pap: 08/06/20 WNL with negative cervical cultures (no hpv testing),  01-11-18 neg HPV HR neg, 12-03-14 neg HPV HR neg History of abnormal Pap:  yes repeat was normal  MMG:  09/17/20 Diag Right Density B Bi-rads 1 neg, scheduled on 09/21/21. BMD:   none  Colonoscopy: none, she has a consultation later this month with GI.   TDaP:  2018  Gardasil: n/a   reports that she has never smoked. She has never used smokeless tobacco. She reports that she does not drink alcohol and does not use drugs. 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 Daughers are 26 and 18. Oldest is in [place], works in Actuary. 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   Past Medical History:  Diagnosis Date   Abnormal pap about 2004   Abnormal uterine bleeding (AUB)    Concussion 2001   no residual    HSV infection    Asymptomatic   Hypertension    Pre-diabetes    Prediabetes    Wears glasses     Past Surgical History:  Procedure Laterality Date   CESAREAN SECTION  04/24/2003   HYSTEROSCOPY WITH D & C N/A 10/14/2020   Procedure: DILATATION AND CURETTAGE /HYSTEROSCOPY WITH MYOSURE;  Surgeon: Salvadore Dom, MD;  Location: [place];  Service: Gynecology;  Laterality: N/A;   INTRAUTERINE DEVICE (IUD) INSERTION N/A 10/14/2020   Procedure: INTRAUTERINE DEVICE (IUD) INSERTION;  Surgeon: Salvadore Dom, MD;  Location: [place];  Service: Gynecology;  Laterality: N/A;  Mirena IUD insertion   WISDOM TOOTH EXTRACTION  early 20's    Current Outpatient Medications  Medication Sig Dispense Refill   acetaminophen (TYLENOL) 500 MG tablet Take 1,000 mg by mouth every 6 (six) hours as needed.     amLODipine (NORVASC) 5 MG tablet 1 tablet     cetirizine (ZYRTEC) 10 MG tablet Take 10 mg by mouth at bedtime.     cholecalciferol (VITAMIN D) 1000 UNITS tablet Take 1,000 Units by mouth as needed.      levonorgestrel (MIRENA, 52 MG,) 20 MCG/24HR IUD See admin instructions.     No current facility-administered medications for this visit.    Family History  Problem Relation Age of Onset   Diabetes Mother    Hypertension Mother    Heart disease Mother  COPD Mother    Diabetes Father    Diabetes Maternal Grandmother    Hypertension Maternal Grandmother    Heart disease Maternal Grandmother    Heart failure Maternal Grandfather    Heart disease Maternal Grandfather    Colon cancer Paternal Uncle 63  Brother died at 76, heart failure, cirrhosis Mom died at 16 suddenly, multiple medical issues.  She has 2 other sisters that she grew up with and a half brother and half sister.   Review of Systems  All other systems reviewed and are negative.  Exam:   BP 110/62    Pulse (!) 111    Ht 5\' 8"  (1.727 m)    Wt 227 lb (103 kg)    LMP 07/31/2021    SpO2 99%     BMI 34.52 kg/m   Weight change: @WEIGHTCHANGE @ Height:   Height: 5\' 8"  (172.7 cm)  Ht Readings from Last 3 Encounters:  08/26/21 5\' 8"  (1.727 m)  10/29/20 5\' 7"  (1.702 m)  10/14/20 5\' 7"  (1.702 m)    General appearance: alert, cooperative and appears stated age Head: Normocephalic, without obvious abnormality, atraumatic Neck: no adenopathy, supple, symmetrical, trachea midline and thyroid normal to inspection and palpation Lungs: clear to auscultation bilaterally Cardiovascular: regular rate and rhythm Breasts: normal appearance, no masses or tenderness Abdomen: soft, non-tender; non distended,  no masses,  no organomegaly Extremities: extremities normal, atraumatic, no cyanosis or edema Skin: Skin color, texture, turgor normal. No rashes or lesions Lymph nodes: Cervical, supraclavicular, and axillary nodes normal. No abnormal inguinal nodes palpated Neurologic: Grossly normal   Pelvic: External genitalia:  no lesions              Urethra:  normal appearing urethra with no masses, tenderness or lesions              Bartholins and Skenes: normal                 Vagina: normal appearing vagina with normal color and discharge, no lesions              Cervix: no lesions and IUD string 3 cm, cervix friable               Bimanual Exam:  Uterus:   no masses or tenderness              Adnexa: no mass, fullness, tenderness               Rectovaginal: Confirms               Anus:  normal sphincter tone, no lesions  Gae Dry chaperoned for the exam.  1. Well woman exam Discussed breast self exam Discussed calcium and vit D intake Mammogram scheduled Colonoscopy is being scheduled Labs with primary No pap this year  2. IUD check up Doing well  3. Friable cervix No change, negative pap and cervical cultures in 12/21  She spots after straining with BM, patient reassured  4. Constipation, unspecified constipation type Try daily Magnesium (500 mg) Information on constipation  given

## 2021-08-26 ENCOUNTER — Encounter: Payer: Self-pay | Admitting: Obstetrics and Gynecology

## 2021-08-26 ENCOUNTER — Other Ambulatory Visit: Payer: Self-pay

## 2021-08-26 ENCOUNTER — Ambulatory Visit (INDEPENDENT_AMBULATORY_CARE_PROVIDER_SITE_OTHER): Payer: 59 | Admitting: Obstetrics and Gynecology

## 2021-08-26 VITALS — BP 110/62 | HR 111 | Ht 68.0 in | Wt 227.0 lb

## 2021-08-26 DIAGNOSIS — Z30431 Encounter for routine checking of intrauterine contraceptive device: Secondary | ICD-10-CM

## 2021-08-26 DIAGNOSIS — Z01419 Encounter for gynecological examination (general) (routine) without abnormal findings: Secondary | ICD-10-CM | POA: Diagnosis not present

## 2021-08-26 DIAGNOSIS — N888 Other specified noninflammatory disorders of cervix uteri: Secondary | ICD-10-CM

## 2021-08-26 DIAGNOSIS — K5900 Constipation, unspecified: Secondary | ICD-10-CM | POA: Diagnosis not present

## 2021-08-26 NOTE — Patient Instructions (Addendum)
TRY MAGNESIUM 500 MG A DAY FOR CONSTIPATION  About Constipation  Constipation Overview Constipation is the most common gastrointestinal complaint -- about 4 million Americans experience constipation and make 2.5 million physician visits a year to get help for the problem.  Constipation can occur when the colon absorbs too much water, the colons muscle contraction is slow or sluggish, and/or there is delayed transit time through the colon.  The result is stool that is hard and dry.  Indicators of constipation include straining during bowel movements greater than 25% of the time, having fewer than three bowel movements per week, and/or the feeling of incomplete evacuation.  There are established guidelines ([place] II ) for defining constipation. A person needs to have two or more of the following symptoms for at least 12 weeks (not necessarily consecutive) in the preceding 12 months: Straining in  greater than 25% of bowel movements Lumpy or hard stools in greater than 25% of bowel movements Sensation of incomplete emptying in greater than 25% of bowel movements Sensation of anorectal obstruction/blockade in greater than 25% of bowel movements Manual maneuvers to help empty greater than 25% of bowel movements (e.g., digital evacuation, support of the pelvic floor)  Less than  3 bowel movements/week Loose stools are not present, and criteria for irritable bowel syndrome are insufficient  Common Causes of Constipation Lack of fiber in your diet Lack of physical activity Medications, including iron and calcium supplements  Dairy intake Dehydration Abuse of laxatives Travel Irritable Bowel Syndrome Pregnancy Luteal phase of menstruation (after ovulation and before menses) Colorectal problems Intestinal Dysfunction  Treating Constipation  There are several ways of treating constipation, including changes to diet and exercise, use of laxatives, adjustments to the pelvic floor, and scheduled  toileting.  These treatments include: increasing fiber and fluids in the diet  increasing physical activity learning muscle coordination  learning proper toileting techniques and toileting modifications  designing and sticking  to a toileting schedule     2007, [employer] Doc.22  EXERCISE   We recommended that you start or continue a regular exercise program for good health. Physical activity is anything that gets your body moving, some is better than none. The CDC recommends 150 minutes per week of Moderate-Intensity Aerobic Activity and 2 or more days of Muscle Strengthening Activity.  Benefits of exercise are limitless: helps weight loss/weight maintenance, improves mood and energy, helps with depression and anxiety, improves sleep, tones and strengthens muscles, improves balance, improves bone density, protects from chronic conditions such as heart disease, high blood pressure and diabetes and so much more. To learn more visit: WhyNotPoker.uy  DIET: Good nutrition starts with a healthy diet of fruits, vegetables, whole grains, and lean protein sources. Drink plenty of water for hydration. Minimize empty calories, sodium, sweets. For more information about dietary recommendations visit: GeekRegister.com.ee and http://schaefer-mitchell.com/  ALCOHOL:  Women should limit their alcohol intake to no more than 7 drinks/beers/glasses of wine (combined, not each!) per week. Moderation of alcohol intake to this level decreases your risk of breast cancer and liver damage.  If you are concerned that you may have a problem, or your friends have told you they are concerned about your drinking, there are many resources to help. A well-known program that is free, effective, and available to all people all over the nation is Alcoholics Anonymous.  Check out this site to learn more: BlockTaxes.se   CALCIUM  AND VITAMIN D:  Adequate intake of calcium and Vitamin D are recommended for  bone health.  You should be getting between 1000-1200 mg of calcium and 800 units of Vitamin D daily between diet and supplements  PAP SMEARS:  Pap smears, to check for cervical cancer or precancers,  have traditionally been done yearly, scientific advances have shown that most women can have pap smears less often.  However, every woman still should have a physical exam from her gynecologist every year. It will include a breast check, inspection of the vulva and vagina to check for abnormal growths or skin changes, a visual exam of the cervix, and then an exam to evaluate the size and shape of the uterus and ovaries. We will also provide age appropriate advice regarding health maintenance, like when you should have certain vaccines, screening for sexually transmitted diseases, bone density testing, colonoscopy, mammograms, etc.   MAMMOGRAMS:  All women over 59 years old should have a routine mammogram.   COLON CANCER SCREENING: Now recommend starting at age 56. At this time colonoscopy is not covered for routine screening until 50. There are take home tests that can be done between 45-49.   COLONOSCOPY:  Colonoscopy to screen for colon cancer is recommended for all women at age 27.  We know, you hate the idea of the prep.  We agree, BUT, having colon cancer and not knowing it is worse!!  Colon cancer so often starts as a polyp that can be seen and removed at colonscopy, which can quite literally save your life!  And if your first colonoscopy is normal and you have no family history of colon cancer, most women don't have to have it again for 10 years.  Once every ten years, you can do something that may end up saving your life, right?  We will be happy to help you get it scheduled when you are ready.  Be sure to check your insurance coverage so you understand how much it will cost.  It may be covered as a preventative service at no  cost, but you should check your particular policy.      Breast Self-Awareness Breast self-awareness means being familiar with how your breasts look and feel. It involves checking your breasts regularly and reporting any changes to your health care provider. Practicing breast self-awareness is important. A change in your breasts can be a sign of a serious medical problem. Being familiar with how your breasts look and feel allows you to find any problems early, when treatment is more likely to be successful. All women should practice breast self-awareness, including women who have had breast implants. How to do a breast self-exam One way to learn what is normal for your breasts and whether your breasts are changing is to do a breast self-exam. To do a breast self-exam: Look for Changes  Remove all the clothing above your waist. Stand in front of a mirror in a room with good lighting. Put your hands on your hips. Push your hands firmly downward. Compare your breasts in the mirror. Look for differences between them (asymmetry), such as: Differences in shape. Differences in size. Puckers, dips, and bumps in one breast and not the other. Look at each breast for changes in your skin, such as: Redness. Scaly areas. Look for changes in your nipples, such as: Discharge. Bleeding. Dimpling. Redness. A change in position. Feel for Changes Carefully feel your breasts for lumps and changes. It is best to do this while lying on your back on the floor and again while sitting or standing in the  shower or tub with soapy water on your skin. Feel each breast in the following way: Place the arm on the side of the breast you are examining above your head. Feel your breast with the other hand. Start in the nipple area and make  inch (2 cm) overlapping circles to feel your breast. Use the pads of your three middle fingers to do this. Apply light pressure, then medium pressure, then firm pressure. The light  pressure will allow you to feel the tissue closest to the skin. The medium pressure will allow you to feel the tissue that is a little deeper. The firm pressure will allow you to feel the tissue close to the ribs. Continue the overlapping circles, moving downward over the breast until you feel your ribs below your breast. Move one finger-width toward the center of the body. Continue to use the  inch (2 cm) overlapping circles to feel your breast as you move slowly up toward your collarbone. Continue the up and down exam using all three pressures until you reach your armpit.  Write Down What You Find  Write down what is normal for each breast and any changes that you find. Keep a written record with breast changes or normal findings for each breast. By writing this information down, you do not need to depend only on memory for size, tenderness, or location. Write down where you are in your menstrual cycle, if you are still menstruating. If you are having trouble noticing differences in your breasts, do not get discouraged. With time you will become more familiar with the variations in your breasts and more comfortable with the exam. How often should I examine my breasts? Examine your breasts every month. If you are breastfeeding, the best time to examine your breasts is after a feeding or after using a breast pump. If you menstruate, the best time to examine your breasts is 5-7 days after your period is over. During your period, your breasts are lumpier, and it may be more difficult to notice changes. When should I see my health care provider? See your health care provider if you notice: A change in shape or size of your breasts or nipples. A change in the skin of your breast or nipples, such as a reddened or scaly area. Unusual discharge from your nipples. A lump or thick area that was not there before. Pain in your breasts. Anything that concerns you.

## 2021-09-21 ENCOUNTER — Ambulatory Visit
Admission: RE | Admit: 2021-09-21 | Discharge: 2021-09-21 | Disposition: A | Discharge status: Home / Self Care | Payer: 59 | Source: Ambulatory Visit

## 2021-09-21 DIAGNOSIS — Z1231 Encounter for screening mammogram for malignant neoplasm of breast: Secondary | ICD-10-CM | POA: Diagnosis not present

## 2021-09-22 ENCOUNTER — Other Ambulatory Visit: Payer: Self-pay | Admitting: Family Medicine

## 2021-09-22 DIAGNOSIS — R928 Other abnormal and inconclusive findings on diagnostic imaging of breast: Secondary | ICD-10-CM

## 2021-09-25 ENCOUNTER — Other Ambulatory Visit: Payer: 59

## 2021-09-29 ENCOUNTER — Ambulatory Visit
Admission: RE | Admit: 2021-09-29 | Discharge: 2021-09-29 | Disposition: A | Discharge status: Home / Self Care | Payer: 59 | Source: Ambulatory Visit | Attending: Family Medicine | Admitting: Family Medicine

## 2021-09-29 ENCOUNTER — Ambulatory Visit: Admission: RE | Admit: 2021-09-29 | Payer: 59 | Source: Ambulatory Visit

## 2021-09-29 DIAGNOSIS — R922 Inconclusive mammogram: Secondary | ICD-10-CM | POA: Diagnosis not present

## 2021-09-29 DIAGNOSIS — R928 Other abnormal and inconclusive findings on diagnostic imaging of breast: Secondary | ICD-10-CM

## 2021-12-04 DIAGNOSIS — D124 Benign neoplasm of descending colon: Secondary | ICD-10-CM | POA: Diagnosis not present

## 2021-12-04 DIAGNOSIS — Z1211 Encounter for screening for malignant neoplasm of colon: Secondary | ICD-10-CM | POA: Diagnosis not present

## 2021-12-04 DIAGNOSIS — K5730 Diverticulosis of large intestine without perforation or abscess without bleeding: Secondary | ICD-10-CM | POA: Diagnosis not present

## 2021-12-09 DIAGNOSIS — Z1211 Encounter for screening for malignant neoplasm of colon: Secondary | ICD-10-CM | POA: Diagnosis not present

## 2022-05-03 DIAGNOSIS — E559 Vitamin D deficiency, unspecified: Secondary | ICD-10-CM | POA: Diagnosis not present

## 2022-05-03 DIAGNOSIS — E785 Hyperlipidemia, unspecified: Secondary | ICD-10-CM | POA: Diagnosis not present

## 2022-05-28 DIAGNOSIS — R350 Frequency of micturition: Secondary | ICD-10-CM | POA: Diagnosis not present

## 2022-05-28 DIAGNOSIS — Z6833 Body mass index (BMI) 33.0-33.9, adult: Secondary | ICD-10-CM | POA: Diagnosis not present

## 2022-08-24 NOTE — Progress Notes (Signed)
52 y.o. [id number] Domestic Partner Black or African American Not Hispanic or Latino female here for annual exam.  She has a mirena IUD, inserted in 3/22 at the time of hysteroscopic myomectomy. Cycles are mostly monthly, occasional skipped cycles. Flow varies from month to month. At times she is saturating an ultra tampon in 3-4 hours. Cramps can be worse than prior to the IUD.   She broke up with her 25 year partner in 9/23, doing okay. She is worried he may have had other partners.  She isn't sexually active.   H/O HSV, occasional outbreaks.   Period Cycle (Days): 21 Period Duration (Days): 5 Period Pattern: Regular Menstrual Flow: Moderate Menstrual Control: Maxi pad Dysmenorrhea: (!) Mild Dysmenorrhea Symptoms: Cramping  No change in constipation, bladder is fine.   Patient's last menstrual period was 08/01/2022.          Sexually active: Yes.    The current method of family planning is IUD.    Exercising:    Smoker:  no  Health Maintenance: Pap:  08/06/20 WNL with negative cervical cultures (no hpv testing),  01-11-18 neg HPV HR neg, 12-03-14 neg HPV HR neg  History of abnormal Pap:   yes repeat was normal   MMG:  09/21/21 density B Bi-rad incomplete tomo 09/29/21 normal  BMD:   n/a Colonoscopy: 2023, 1 polyp removed. F/U 10 ten years.  TDaP:  2018 Gardasil: n/a   reports that she has never smoked. She has never used smokeless tobacco. She reports that she does not drink alcohol and does not use drugs. 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 Daughers are 27 and 19. Oldest is in [place], works in Actuary. 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    Past Medical History:  Diagnosis Date   Abnormal pap about 2004   Abnormal uterine bleeding (AUB)    Concussion 2001   no residual   HSV infection    Asymptomatic   Hypertension    Pre-diabetes    Prediabetes    Wears glasses     Past Surgical History:  Procedure Laterality Date    CESAREAN SECTION  04/24/2003   HYSTEROSCOPY WITH D & C N/A 10/14/2020   Procedure: DILATATION AND CURETTAGE /HYSTEROSCOPY WITH MYOSURE;  Surgeon: Salvadore Dom, MD;  Location: [place];  Service: Gynecology;  Laterality: N/A;   INTRAUTERINE DEVICE (IUD) INSERTION N/A 10/14/2020   Procedure: INTRAUTERINE DEVICE (IUD) INSERTION;  Surgeon: Salvadore Dom, MD;  Location: [place];  Service: Gynecology;  Laterality: N/A;  Mirena IUD insertion   WISDOM TOOTH EXTRACTION  early 20's    Current Outpatient Medications  Medication Sig Dispense Refill   acetaminophen (TYLENOL) 500 MG tablet Take 1,000 mg by mouth every 6 (six) hours as needed.     amLODipine (NORVASC) 5 MG tablet 1 tablet     cetirizine (ZYRTEC) 10 MG tablet Take 10 mg by mouth at bedtime.     cholecalciferol (VITAMIN D) 1000 UNITS tablet Take 1,000 Units by mouth as needed.      levonorgestrel (MIRENA, 52 MG,) 20 MCG/24HR IUD See admin instructions.     No current facility-administered medications for this visit.    Family History  Problem Relation Age of Onset   Diabetes Mother    Hypertension Mother    Heart disease Mother    COPD Mother    Diabetes Father    Diabetes Maternal  Grandmother    Hypertension Maternal Grandmother    Heart disease Maternal Grandmother    Heart failure Maternal Grandfather    Heart disease Maternal Grandfather    Colon cancer Paternal Uncle 107    Review of Systems  All other systems reviewed and are negative.   Exam:   Ht '5\' 8"'$  (1.727 m)   Wt 219 lb (99.3 kg)   LMP 08/01/2022   BMI 33.30 kg/m   Weight change: '@WEIGHTCHANGE'$ @ Height:   Height: '5\' 8"'$  (172.7 cm)  Ht Readings from Last 3 Encounters:  09/01/22 '5\' 8"'$  (1.727 m)  08/26/21 '5\' 8"'$  (1.727 m)  10/29/20 '5\' 7"'$  (1.702 m)    General appearance: alert, cooperative and appears stated age Head: Normocephalic, without obvious abnormality, atraumatic Neck: no adenopathy, supple, symmetrical,  trachea midline and thyroid normal to inspection and palpation Lungs: clear to auscultation bilaterally Cardiovascular: regular rate and rhythm Breasts: normal appearance, no masses or tenderness Abdomen: soft, non-tender; non distended,  no masses,  no organomegaly Extremities: extremities normal, atraumatic, no cyanosis or edema Skin: Skin color, texture, turgor normal. No rashes or lesions Lymph nodes: Cervical, supraclavicular, and axillary nodes normal. No abnormal inguinal nodes palpated Neurologic: Grossly normal   Pelvic: External genitalia:  no lesions              Urethra:  normal appearing urethra with no masses, tenderness or lesions              Bartholins and Skenes: normal                 Vagina: normal appearing vagina with normal color and discharge, no lesions              Cervix: no lesions, IUD string 2 cm               Bimanual Exam:  Uterus:  normal size, contour, position, consistency, mobility, non-tender              Adnexa: no mass, fullness, tenderness               Rectovaginal: Confirms               Anus:  normal sphincter tone, no lesions  Kimalexis, RMA chaperoned for the exam.  1. Well woman exam Discussed breast self exam Discussed calcium and vit D intake Mammogram next month Colonoscopy UTD Labs with primary  2. Screening for cervical cancer - Cytology - PAP  3. Screening examination for STD (sexually transmitted disease) - Cytology - PAP - RPR - HIV Antibody (routine testing w rflx) - Hepatitis C antibody  4. History of herpes genitalis - valACYclovir (VALTREX) 500 MG tablet; Take one tablet po BID x 3 days prn  Dispense: 30 tablet; Refill: 1

## 2022-08-31 ENCOUNTER — Other Ambulatory Visit: Payer: Self-pay | Admitting: Family Medicine

## 2022-08-31 DIAGNOSIS — Z1231 Encounter for screening mammogram for malignant neoplasm of breast: Secondary | ICD-10-CM

## 2022-09-01 ENCOUNTER — Encounter: Payer: Self-pay | Admitting: Obstetrics and Gynecology

## 2022-09-01 ENCOUNTER — Other Ambulatory Visit (HOSPITAL_COMMUNITY)
Admission: RE | Admit: 2022-09-01 | Discharge: 2022-09-01 | Disposition: A | Discharge status: Home / Self Care | Payer: 59 | Source: Ambulatory Visit | Attending: Obstetrics and Gynecology | Admitting: Obstetrics and Gynecology

## 2022-09-01 ENCOUNTER — Ambulatory Visit (INDEPENDENT_AMBULATORY_CARE_PROVIDER_SITE_OTHER): Payer: 59 | Admitting: Obstetrics and Gynecology

## 2022-09-01 VITALS — BP 124/80 | HR 92 | Ht 68.0 in | Wt 219.0 lb

## 2022-09-01 DIAGNOSIS — Z113 Encounter for screening for infections with a predominantly sexual mode of transmission: Secondary | ICD-10-CM | POA: Insufficient documentation

## 2022-09-01 DIAGNOSIS — Z8619 Personal history of other infectious and parasitic diseases: Secondary | ICD-10-CM

## 2022-09-01 DIAGNOSIS — Z01419 Encounter for gynecological examination (general) (routine) without abnormal findings: Secondary | ICD-10-CM

## 2022-09-01 DIAGNOSIS — Z124 Encounter for screening for malignant neoplasm of cervix: Secondary | ICD-10-CM | POA: Insufficient documentation

## 2022-09-01 DIAGNOSIS — R69 Illness, unspecified: Secondary | ICD-10-CM | POA: Diagnosis not present

## 2022-09-01 MED ORDER — VALACYCLOVIR HCL 500 MG PO TABS: ORAL_TABLET | ORAL | 1 refills | Status: DC

## 2022-09-01 NOTE — Patient Instructions (Signed)

## 2022-09-02 LAB — HIV ANTIBODY (ROUTINE TESTING W REFLEX): HIV 1&2 Ab, 4th Generation: NONREACTIVE

## 2022-09-02 LAB — RPR: RPR Ser Ql: NONREACTIVE

## 2022-09-02 LAB — HEPATITIS C ANTIBODY: Hepatitis C Ab: NONREACTIVE

## 2022-09-03 LAB — CYTOLOGY - PAP
Chlamydia: NEGATIVE
Comment: NEGATIVE
Comment: NEGATIVE
Comment: NEGATIVE
Comment: NORMAL
Diagnosis: NEGATIVE
Diagnosis: REACTIVE
High risk HPV: NEGATIVE
Neisseria Gonorrhea: NEGATIVE
Trichomonas: NEGATIVE

## 2022-10-20 ENCOUNTER — Ambulatory Visit
Admission: RE | Admit: 2022-10-20 | Discharge: 2022-10-20 | Disposition: A | Discharge status: Home / Self Care | Payer: 59 | Source: Ambulatory Visit | Attending: Family Medicine | Admitting: Family Medicine

## 2022-10-20 DIAGNOSIS — Z1231 Encounter for screening mammogram for malignant neoplasm of breast: Secondary | ICD-10-CM

## 2023-04-08 DIAGNOSIS — Z6832 Body mass index (BMI) 32.0-32.9, adult: Secondary | ICD-10-CM | POA: Diagnosis not present

## 2023-04-08 DIAGNOSIS — R319 Hematuria, unspecified: Secondary | ICD-10-CM | POA: Diagnosis not present

## 2023-04-08 DIAGNOSIS — N390 Urinary tract infection, site not specified: Secondary | ICD-10-CM | POA: Diagnosis not present

## 2023-04-08 DIAGNOSIS — B379 Candidiasis, unspecified: Secondary | ICD-10-CM | POA: Diagnosis not present

## 2023-04-08 DIAGNOSIS — R300 Dysuria: Secondary | ICD-10-CM | POA: Diagnosis not present

## 2023-04-08 DIAGNOSIS — T3695XA Adverse effect of unspecified systemic antibiotic, initial encounter: Secondary | ICD-10-CM | POA: Diagnosis not present

## 2023-05-11 DIAGNOSIS — R739 Hyperglycemia, unspecified: Secondary | ICD-10-CM | POA: Diagnosis not present

## 2023-05-11 DIAGNOSIS — E785 Hyperlipidemia, unspecified: Secondary | ICD-10-CM | POA: Diagnosis not present

## 2023-05-11 DIAGNOSIS — E559 Vitamin D deficiency, unspecified: Secondary | ICD-10-CM | POA: Diagnosis not present

## 2023-05-11 DIAGNOSIS — I10 Essential (primary) hypertension: Secondary | ICD-10-CM | POA: Diagnosis not present

## 2023-05-12 ENCOUNTER — Ambulatory Visit (INDEPENDENT_AMBULATORY_CARE_PROVIDER_SITE_OTHER): Payer: 59 | Admitting: Obstetrics and Gynecology

## 2023-05-12 ENCOUNTER — Encounter: Payer: Self-pay | Admitting: Obstetrics and Gynecology

## 2023-05-12 ENCOUNTER — Telehealth: Payer: Self-pay | Admitting: Obstetrics and Gynecology

## 2023-05-12 VITALS — BP 110/76 | Wt 206.0 lb

## 2023-05-12 DIAGNOSIS — N939 Abnormal uterine and vaginal bleeding, unspecified: Secondary | ICD-10-CM

## 2023-05-12 DIAGNOSIS — Z113 Encounter for screening for infections with a predominantly sexual mode of transmission: Secondary | ICD-10-CM | POA: Diagnosis not present

## 2023-05-12 LAB — CBC
HCT: 41.1 % (ref 35.0–45.0)
Hemoglobin: 13.4 g/dL (ref 11.7–15.5)
MCH: 29.5 pg (ref 27.0–33.0)
MCHC: 32.6 g/dL (ref 32.0–36.0)
MCV: 90.3 fL (ref 80.0–100.0)
MPV: 11.3 fL (ref 7.5–12.5)
Platelets: 234 10*3/uL (ref 140–400)
RBC: 4.55 10*6/uL (ref 3.80–5.10)
RDW: 12.4 % (ref 11.0–15.0)
WBC: 6 10*3/uL (ref 3.8–10.8)

## 2023-05-12 NOTE — Progress Notes (Signed)
52 y.o. [id number] female here for IUD removal secondary to AUB. Pt had a hysteroscopic myomectomy, polypectomy and IUD insertion on 10/14/2020.  Patient reports a change in cycles over the past 2 years. Notes that cycles were improved for first year, now with longer heavier bleeding. Had a 10d period in July with HVB and clots LMP: 13d cycle in August Also notes irregular bleeding and ongoing spotting Having a light period now, started yesterday She reports that her bleeding was improved on COC prior to IUD insertion, cycle was ~6d  Last mammogram: 10/20/22 BIRADS 1 Last colonoscopy: 2023 Sexually active: yes   GYN HISTORY: Hysteroscopic myomectomy, polypectomy and IUD insertion on 10/14/2020 H/o HSV  OB History  Gravida Para Term Preterm AB Living  3 2 2  0 1 2  SAB IAB Ectopic Multiple Live Births  0 0 0 0 2    # Outcome Date GA Lbr Len/2nd Weight Sex Type Anes PTL Lv  3 AB           2 Term     F    LIV  1 Term     F    LIV    Past Medical History:  Diagnosis Date   Abnormal pap about 2004   Abnormal uterine bleeding (AUB)    Concussion 2001   no residual   HSV infection    Asymptomatic   Hypertension    Pre-diabetes    Prediabetes    Wears glasses     Past Surgical History:  Procedure Laterality Date   CESAREAN SECTION  04/24/2003   HYSTEROSCOPY WITH D & C N/A 10/14/2020   Procedure: DILATATION AND CURETTAGE /HYSTEROSCOPY WITH MYOSURE;  Surgeon: Romualdo Bolk, MD;  Location: [facility] [place];  Service: Gynecology;  Laterality: N/A;   INTRAUTERINE DEVICE (IUD) INSERTION N/A 10/14/2020   Procedure: INTRAUTERINE DEVICE (IUD) INSERTION;  Surgeon: Romualdo Bolk, MD;  Location: [facility];  Service: Gynecology;  Laterality: N/A;  Mirena IUD insertion   WISDOM TOOTH EXTRACTION  early 20's    Current Outpatient Medications on File Prior to Visit  Medication Sig Dispense Refill   acetaminophen (TYLENOL) 500 MG tablet Take 1,000  mg by mouth every 6 (six) hours as needed.     amLODipine (NORVASC) 5 MG tablet 1 tablet     cetirizine (ZYRTEC) 10 MG tablet Take 10 mg by mouth at bedtime.     cholecalciferol (VITAMIN D) 1000 UNITS tablet Take 1,000 Units by mouth as needed.      levonorgestrel (MIRENA, 52 MG,) 20 MCG/24HR IUD See admin instructions.     valACYclovir (VALTREX) 500 MG tablet Take one tablet po BID x 3 days prn 30 tablet 1   No current facility-administered medications on file prior to visit.    Social History   Socioeconomic History   Marital status: Media planner    Spouse name: Not on file   Number of children: Not on file   Years of education: Not on file   Highest education level: Not on file  Occupational History   Not on file  Tobacco Use   Smoking status: Never   Smokeless tobacco: Never  Vaping Use   Vaping status: Never Used  Substance and Sexual Activity   Alcohol use: No   Drug use: No   Sexual activity: Yes    Partners: Male    Birth control/protection: Pill  Other Topics Concern   Not on file  Social History Narrative  Not on file   Social Determinants of Health   Financial Resource Strain: Not on file  Food Insecurity: Not on file  Transportation Needs: Not on file  Physical Activity: Not on file  Stress: Not on file  Social Connections: Not on file  Intimate Partner Violence: Not on file    Family History  Problem Relation Age of Onset   Diabetes Mother    Hypertension Mother    Heart disease Mother    COPD Mother    Diabetes Father    Diabetes Maternal Grandmother    Hypertension Maternal Grandmother    Heart disease Maternal Grandmother    Heart failure Maternal Grandfather    Heart disease Maternal Grandfather    Colon cancer Paternal Uncle 2    No Known Allergies    PE Vitals:   05/12/23 1057  BP: 110/76  Weight: 206 lb (93.4 kg)    General appearance: alert, cooperative and appears stated age Head: Normocephalic, without obvious  abnormality, atraumatic Lungs: normal respiratory effort Abdomen: soft, non-tender; no masses,  no organomegaly Pelvic: External genitalia:  no lesions              Urethra:  normal appearing urethra with no masses, tenderness or lesions              Bartholins and Skenes: normal                 Vagina: normal appearing vagina with normal color and discharge, no lesions, minimal VB              Cervix: no lesions, no cervical motion tenderness, +IUD stings               Bimanual Exam:  Uterus:  normal size, contour, position, consistency, mobility, non-tender              Adnexa: no mass, fullness, tenderness Extremities: extremities normal, atraumatic, no cyanosis or edema Skin: Skin color and texture normal. No rashes or lesions Lymph nodes: Axillary and inguinal nodes normal. Neurologic: Grossly normal         Chaperone was present for exam.   Assessment and Plan:        1. Abnormal uterine bleeding (AUB), progression Discussed option of removal followed by work-up or work-up followed by management including possible IUD removal. Will maintain IUD at this time and complete AUB w/u, including TVUS followed by EMB. Also discussed NSAID use PRN for AUB - CBC - FSH - TSH - Prolactin - SURESWAB CT/NG/T. vaginalis - Endometrial biopsy; Future  2. Screening examination for STD (sexually transmitted disease) - HIV antibody (with reflex) - RPR   Fatim Vanderschaaf V Taira Knabe

## 2023-05-12 NOTE — Telephone Encounter (Signed)
Per Dr. Kennith Center:  "Please schedule pelvic US for patient. She will also need to be scheduled for EMB 14d after TVUS is done."  Order placed for [place].  Will f/u once [place] is scheduled to schedule pt for OV w/ EMB.

## 2023-05-13 NOTE — Telephone Encounter (Signed)
Per review of EPIC, patient scheduled for PUS on 05/17/23.

## 2023-05-13 NOTE — Telephone Encounter (Signed)
Spoke with patient. EMB scheduled for 06/09/23 at 1000. Patient declined earlier appt.  Instructed to take Motrin 800 mg with food and water one hour before procedure.  Routing to provider for final review. Patient is agreeable to disposition. Will close encounter.

## 2023-05-14 LAB — TSH: TSH: 0.77 m[IU]/L

## 2023-05-14 LAB — RPR: RPR Ser Ql: NONREACTIVE

## 2023-05-14 LAB — SURESWAB CT/NG/T. VAGINALIS
C. trachomatis RNA, TMA: NOT DETECTED
N. gonorrhoeae RNA, TMA: NOT DETECTED
Trichomonas vaginalis RNA: NOT DETECTED

## 2023-05-14 LAB — FOLLICLE STIMULATING HORMONE: FSH: 33.2 m[IU]/mL

## 2023-05-14 LAB — HIV ANTIBODY (ROUTINE TESTING W REFLEX): HIV 1&2 Ab, 4th Generation: NONREACTIVE

## 2023-05-14 LAB — PROLACTIN: Prolactin: 5.7 ng/mL

## 2023-05-17 ENCOUNTER — Ambulatory Visit
Admission: RE | Admit: 2023-05-17 | Discharge: 2023-05-17 | Disposition: A | Discharge status: Home / Self Care | Payer: 59 | Source: Ambulatory Visit | Attending: Obstetrics and Gynecology | Admitting: Obstetrics and Gynecology

## 2023-05-17 DIAGNOSIS — D259 Leiomyoma of uterus, unspecified: Secondary | ICD-10-CM | POA: Diagnosis not present

## 2023-05-17 DIAGNOSIS — N939 Abnormal uterine and vaginal bleeding, unspecified: Secondary | ICD-10-CM | POA: Diagnosis not present

## 2023-06-09 ENCOUNTER — Ambulatory Visit: Payer: 59 | Admitting: Obstetrics and Gynecology

## 2023-06-09 ENCOUNTER — Other Ambulatory Visit (HOSPITAL_COMMUNITY)
Admission: RE | Admit: 2023-06-09 | Discharge: 2023-06-09 | Disposition: A | Discharge status: Home / Self Care | Payer: 59 | Source: Ambulatory Visit | Attending: Obstetrics and Gynecology | Admitting: Obstetrics and Gynecology

## 2023-06-09 ENCOUNTER — Encounter: Payer: Self-pay | Admitting: Obstetrics and Gynecology

## 2023-06-09 VITALS — BP 124/72 | HR 72 | Wt 209.0 lb

## 2023-06-09 DIAGNOSIS — N939 Abnormal uterine and vaginal bleeding, unspecified: Secondary | ICD-10-CM | POA: Insufficient documentation

## 2023-06-09 DIAGNOSIS — D252 Subserosal leiomyoma of uterus: Secondary | ICD-10-CM

## 2023-06-09 DIAGNOSIS — N840 Polyp of corpus uteri: Secondary | ICD-10-CM | POA: Diagnosis not present

## 2023-06-09 DIAGNOSIS — D251 Intramural leiomyoma of uterus: Secondary | ICD-10-CM

## 2023-06-09 MED ORDER — LIDOCAINE HCL (PF) 1 % IJ SOLN: 10.0000 mL | Freq: Once | INTRAMUSCULAR | Status: DC

## 2023-06-09 NOTE — Progress Notes (Signed)
52 y.o. [id number] female with AUB-F, fibroids and Mirena IUD who presents for EMB. Pt had a hysteroscopic myomectomy, polypectomy and IUD insertion on 10/14/2020. No LMP recorded. (Menstrual status: IUD).  At last apt, she noted: "Patient reports a change in cycles over the past 2 years. Notes that cycles were improved for first year, now with longer heavier bleeding. Had a 10d period in July with HVB and clots LMP: 13d cycle in August Also notes irregular bleeding and ongoing spotting Having a light period now, started yesterday She reports that her bleeding was improved on COC prior to IUD insertion, cycle was ~6d"  Today, she reports no cycle this month. Last episode of bleeding was in August.  Last mammogram: 10/20/22 BIRADS 1 Last colonoscopy: 2023 Sexually active: yes   GYN HISTORY: Hysteroscopic myomectomy, polypectomy and IUD insertion on 10/14/2020 H/o HSV  OB History  Gravida Para Term Preterm AB Living  3 2 2  0 1 2  SAB IAB Ectopic Multiple Live Births  0 0 0 0 2    # Outcome Date GA Lbr Len/2nd Weight Sex Type Anes PTL Lv  3 AB           2 Term     F    LIV  1 Term     F    LIV    Past Medical History:  Diagnosis Date   Abnormal pap about 2004   Abnormal uterine bleeding (AUB)    Concussion 2001   no residual   HSV infection    Asymptomatic   Hypertension    Pre-diabetes    Prediabetes    Wears glasses     Past Surgical History:  Procedure Laterality Date   CESAREAN SECTION  04/24/2003   HYSTEROSCOPY WITH D & C N/A 10/14/2020   Procedure: DILATATION AND CURETTAGE /HYSTEROSCOPY WITH MYOSURE;  Surgeon: Romualdo Bolk, MD;  Location: [facility];  Service: Gynecology;  Laterality: N/A;   INTRAUTERINE DEVICE (IUD) INSERTION N/A 10/14/2020   Procedure: INTRAUTERINE DEVICE (IUD) INSERTION;  Surgeon: Romualdo Bolk, MD;  Location: [facility];  Service: Gynecology;  Laterality: N/A;  Mirena IUD insertion   WISDOM  TOOTH EXTRACTION  early 20's    Current Outpatient Medications on File Prior to Visit  Medication Sig Dispense Refill   acetaminophen (TYLENOL) 500 MG tablet Take 1,000 mg by mouth every 6 (six) hours as needed.     amLODipine (NORVASC) 5 MG tablet 1 tablet     cetirizine (ZYRTEC) 10 MG tablet Take 10 mg by mouth at bedtime.     cholecalciferol (VITAMIN D) 1000 UNITS tablet Take 1,000 Units by mouth as needed.      levonorgestrel (MIRENA, 52 MG,) 20 MCG/24HR IUD See admin instructions.     valACYclovir (VALTREX) 500 MG tablet Take one tablet po BID x 3 days prn 30 tablet 1   No current facility-administered medications on file prior to visit.    Social History   Socioeconomic History   Marital status: Media planner    Spouse name: Not on file   Number of children: Not on file   Years of education: Not on file   Highest education level: Not on file  Occupational History   Not on file  Tobacco Use   Smoking status: Never   Smokeless tobacco: Never  Vaping Use   Vaping status: Never Used  Substance and Sexual Activity   Alcohol use: No   Drug use: No  Sexual activity: Yes    Partners: Male    Birth control/protection: Pill  Other Topics Concern   Not on file  Social History Narrative   Not on file   Social Determinants of Health   Financial Resource Strain: Not on file  Food Insecurity: Not on file  Transportation Needs: Not on file  Physical Activity: Not on file  Stress: Not on file  Social Connections: Not on file  Intimate Partner Violence: Not on file    Family History  Problem Relation Age of Onset   Diabetes Mother    Hypertension Mother    Heart disease Mother    COPD Mother    Diabetes Father    Diabetes Maternal Grandmother    Hypertension Maternal Grandmother    Heart disease Maternal Grandmother    Heart failure Maternal Grandfather    Heart disease Maternal Grandfather    Colon cancer Paternal Uncle 20    No Known Allergies     PE Vitals:   06/09/23 1002 06/09/23 1039  BP: 114/72 124/72  Pulse: 72   Weight: 209 lb (94.8 kg)   SpO2: 96% Comment: O2 99, Pulse 67     Physical Exam Vitals reviewed. Exam conducted with a chaperone present.  Constitutional:      General: She is not in acute distress.    Appearance: Normal appearance.  HENT:     Head: Normocephalic and atraumatic.     Nose: Nose normal.  Eyes:     Extraocular Movements: Extraocular movements intact.     Conjunctiva/sclera: Conjunctivae normal.  Pulmonary:     Effort: Pulmonary effort is normal.  Genitourinary:    General: Normal vulva.     Exam position: Lithotomy position.     Vagina: Normal. No vaginal discharge.     Cervix: Normal. No cervical motion tenderness, discharge or lesion.     Uterus: Normal. Enlarged. Not tender.      Adnexa: Right adnexa normal and left adnexa normal.     Comments: 16wk uterus Musculoskeletal:        General: Normal range of motion.     Cervical back: Normal range of motion.  Neurological:     General: No focal deficit present.     Mental Status: She is alert.  Psychiatric:        Mood and Affect: Mood normal.        Behavior: Behavior normal.      Procedure Speculum inserted into the vagina, cervix visualized and was prepped with Betadine.  Cervical block was performed with 10cc 1% lidocaine (Lot#: [id number], Exp 02/2025). A single-toothed tenaculum was placed on the anterior lip of the cervix to stabilize it.  The 3 mm pipelle was introduced into the endometrial cavity without difficulty to a depth of 10cm, suction initiated and a moderate amount of tissue was obtained over 2 passes and sent to pathology.  The instruments were removed from the patient's vagina.  Minimal bleeding from the cervix was noted.  The patient tolerated the procedure well.       06/09/2023   10:39 AM 06/09/2023   10:02 AM  Vitals with BMI  Weight  209 lbs  Systolic 124 114  Diastolic 72 72  Pulse  72     Assessment and Plan:       Abnormal uterine bleeding (AUB) Assessment & Plan: W/u notable for elevated FSH 33 and 05/17/23 TVUS with 16cm uterus (12cm in 2022) with multiple leiomyoma, largest is 6cm. 2022 [place] noted  6 fibroids, however most recent US only measured 4. Uncomplicated EMB Discussed removal with other hormonal management, TXA and NSAID use. Patient elects to keep IUD and f/u for annual in 3 months All questions answered.  Orders: -     Surgical pathology -     Lidocaine HCl (PF)  Intramural and subserous leiomyoma of uterus Assessment & Plan: Known fibroids Enlarging uterus discussed with patient Given less frequent cycles, patient would like to keep IUD and continue to monitor AUB.      Lyndsie Wallman Lasandra Beech

## 2023-06-09 NOTE — Patient Instructions (Signed)
It is common to have vaginal bleeding and cramping for up to 72 hours after your biopsy. Please call our office with heavy vaginal bleeding, severe abdominal pain or fever. Avoid intercourse, tampon use, douching and baths for 7 days to decrease the risk of infection.

## 2023-06-09 NOTE — Assessment & Plan Note (Signed)
Known fibroids Enlarging uterus discussed with patient Given less frequent cycles, patient would like to keep IUD and continue to monitor AUB.

## 2023-06-09 NOTE — Assessment & Plan Note (Addendum)
W/u notable for elevated FSH 33 and 05/17/23 TVUS with 16cm uterus (12cm in 2022) with multiple leiomyoma, largest is 6cm. 2022 [place] noted 6 fibroids, however most recent US only measured 4. Uncomplicated EMB Discussed removal with other hormonal management, TXA and NSAID use. Patient elects to keep IUD and f/u for annual in 3 months All questions answered.

## 2023-06-13 LAB — SURGICAL PATHOLOGY

## 2023-06-15 ENCOUNTER — Other Ambulatory Visit: Payer: Self-pay | Admitting: Family Medicine

## 2023-06-15 DIAGNOSIS — Z1231 Encounter for screening mammogram for malignant neoplasm of breast: Secondary | ICD-10-CM

## 2023-06-29 ENCOUNTER — Ambulatory Visit: Payer: 59 | Admitting: Obstetrics and Gynecology

## 2023-06-29 ENCOUNTER — Encounter: Payer: Self-pay | Admitting: Obstetrics and Gynecology

## 2023-06-29 VITALS — BP 124/78 | HR 79 | Ht 66.93 in | Wt 205.0 lb

## 2023-06-29 DIAGNOSIS — N840 Polyp of corpus uteri: Secondary | ICD-10-CM | POA: Diagnosis not present

## 2023-06-29 DIAGNOSIS — R300 Dysuria: Secondary | ICD-10-CM | POA: Diagnosis not present

## 2023-06-29 DIAGNOSIS — N939 Abnormal uterine and vaginal bleeding, unspecified: Secondary | ICD-10-CM

## 2023-06-29 NOTE — Assessment & Plan Note (Addendum)
Originally seen 05/12/23 with complaints of worsening AUB (longer heavier bleeding, 10d, with clots) despite Mirena IUD and desiring removal. Of note, she had a hysteroscopic myomectomy, polypectomy and IUD insertion on 10/14/2020.    W/u was completed with revealed: Elevated FSH 33 and  05/17/23 TVUS: 16cm uterus (12cm in 2022) with multiple leiomyoma, largest is 6cm. 2022 [place] noted 6 fibroids, however most recent US only measured 4.  06/09/23 EMB: "Inactive endometrium with progestational changes. Fragments of benign endometrial polyp. Negative for hyperplasia or malignancy. " Pt elected to maintain IUD after completion of w/u. Plan for hysteroscopy in early 2025.

## 2023-06-29 NOTE — Assessment & Plan Note (Addendum)
Recommend diagnostic, hysteroscopy, polypectomy, and D&C.  Discussed outpatient procedure. Reviewed that  recovery is usually 1-2 days.   Patient in the middle of changing insurance and is hoping to schedule at the beginning of next year. Will discuss further at annual exam scheduled early 2025.

## 2023-06-29 NOTE — Progress Notes (Signed)
52 y.o. [id number] female with AUB-F, Mirena IUD, endometrial polyp on EMB here for surgical consultation for diagnostic hysteroscopy, polypectomy, D&C. Pt had a hysteroscopic myomectomy, polypectomy and IUD insertion on 10/14/2020.   No LMP recorded. (Menstrual status: IUD).  Originally seen 05/12/23 with complaints of worsening AUB (longer heavier bleeding, 10d, with clots) despite Mirena IUD and desiring removal.  W/u was completed with revealed: Elevated FSH 33 and  05/17/23 TVUS: 16cm uterus (12cm in 2022) with multiple leiomyoma, largest is 6cm. 2022 [place] noted 6 fibroids, however most recent US only measured 4.  06/09/23 EMB: "Inactive endometrium with progestational changes. Fragments of benign endometrial polyp. Negative for hyperplasia or malignancy. " Pt elected to maintain IUD after completion of w/u.  Today, she still has some spotting. Pt is requesting U/A due to having some burning with urination. She states she has a hx of UTIs.   Last PAP:    Component Value Date/Time   DIAGPAP  09/01/2022 1002    - Negative for Intraepithelial Lesions or Malignancy (NILM)   DIAGPAP - Benign reactive/reparative changes 09/01/2022 1002   DIAGPAP - Benign reactive/reparative changes 08/06/2020 1014   DIAGPAP - See comment 08/06/2020 1014   DIAGPAP  08/06/2020 1014    - Negative for Intraepithelial Lesions or Malignancy (NILM)   HPVHIGH Negative 09/01/2022 1002   ADEQPAP  09/01/2022 1002    Satisfactory for evaluation; transformation zone component PRESENT.   ADEQPAP  08/06/2020 1014    Satisfactory for evaluation; transformation zone component PRESENT.   ADEQPAP  01/11/2018 0000    Satisfactory for evaluation  endocervical/transformation zone component PRESENT.   Birth control: Mirena Sexually active: yes    GYN HISTORY: Hysteroscopic myomectomy, polypectomy and IUD insertion on 10/14/2020 H/o HSV  OB History  Gravida Para Term Preterm AB Living  3 2 2  0 1 2  SAB IAB  Ectopic Multiple Live Births  0 0 0 0 2    # Outcome Date GA Lbr Len/2nd Weight Sex Type Anes PTL Lv  3 AB           2 Term     F    LIV  1 Term     F    LIV    Past Medical History:  Diagnosis Date   Abnormal pap about 2004   Abnormal uterine bleeding (AUB)    Concussion 2001   no residual   HSV infection    Asymptomatic   Hypertension    Pre-diabetes    Prediabetes    Wears glasses     Past Surgical History:  Procedure Laterality Date   CESAREAN SECTION  04/24/2003   HYSTEROSCOPY WITH D & C N/A 10/14/2020   Procedure: DILATATION AND CURETTAGE /HYSTEROSCOPY WITH MYOSURE;  Surgeon: Romualdo Bolk, MD;  Location: [facility];  Service: Gynecology;  Laterality: N/A;   INTRAUTERINE DEVICE (IUD) INSERTION N/A 10/14/2020   Procedure: INTRAUTERINE DEVICE (IUD) INSERTION;  Surgeon: Romualdo Bolk, MD;  Location: [facility];  Service: Gynecology;  Laterality: N/A;  Mirena IUD insertion   WISDOM TOOTH EXTRACTION  early 20's    Current Outpatient Medications on File Prior to Visit  Medication Sig Dispense Refill   acetaminophen (TYLENOL) 500 MG tablet Take 1,000 mg by mouth every 6 (six) hours as needed.     amLODipine (NORVASC) 5 MG tablet 1 tablet     cetirizine (ZYRTEC) 10 MG tablet Take 10 mg by mouth at bedtime.  cholecalciferol (VITAMIN D) 1000 UNITS tablet Take 1,000 Units by mouth as needed.      levonorgestrel (MIRENA, 52 MG,) 20 MCG/24HR IUD See admin instructions.     valACYclovir (VALTREX) 500 MG tablet Take one tablet po BID x 3 days prn 30 tablet 1   No current facility-administered medications on file prior to visit.    No Known Allergies    PE Today's Vitals   06/29/23 1151  BP: 124/78  Pulse: 79  SpO2: 100%  Weight: 205 lb (93 kg)  Height: 5' 6.93" (1.7 m)   Body mass index is 32.18 kg/m.  Physical Exam Vitals reviewed.  Constitutional:      General: She is not in acute distress.    Appearance: Normal  appearance.  HENT:     Head: Normocephalic and atraumatic.     Nose: Nose normal.  Eyes:     Extraocular Movements: Extraocular movements intact.     Conjunctiva/sclera: Conjunctivae normal.  Pulmonary:     Effort: Pulmonary effort is normal.  Musculoskeletal:        General: Normal range of motion.     Cervical back: Normal range of motion.  Neurological:     General: No focal deficit present.     Mental Status: She is alert.  Psychiatric:        Mood and Affect: Mood normal.        Behavior: Behavior normal.       Assessment and Plan:        Endometrial polyp Assessment & Plan: Recommend diagnostic, hysteroscopy, polypectomy, and D&C.  Discussed outpatient procedure. Reviewed that  recovery is usually 1-2 days.   Patient in the middle of changing insurance and is hoping to schedule at the beginning of next year. Will discuss further at annual exam scheduled early 2025.   Abnormal uterine bleeding (AUB) Assessment & Plan: Originally seen 05/12/23 with complaints of worsening AUB (longer heavier bleeding, 10d, with clots) despite Mirena IUD and desiring removal. Of note, she had a hysteroscopic myomectomy, polypectomy and IUD insertion on 10/14/2020.    W/u was completed with revealed: Elevated FSH 33 and  05/17/23 TVUS: 16cm uterus (12cm in 2022) with multiple leiomyoma, largest is 6cm. 2022 [place] noted 6 fibroids, however most recent US only measured 4.  06/09/23 EMB: "Inactive endometrium with progestational changes. Fragments of benign endometrial polyp. Negative for hyperplasia or malignancy. " Pt elected to maintain IUD after completion of w/u. Plan for hysteroscopy in early 2025.   Dysuria -     Urinalysis,Complete w/RFL Culture     Rosalyn Gess, MD

## 2023-07-01 LAB — URINALYSIS, COMPLETE W/RFL CULTURE
Bilirubin Urine: NEGATIVE
Glucose, UA: NEGATIVE
Hyaline Cast: NONE SEEN /[LPF]
Ketones, ur: NEGATIVE
Nitrites, Initial: NEGATIVE
Protein, ur: NEGATIVE
Specific Gravity, Urine: 1.01 (ref 1.001–1.035)
pH: 7 (ref 5.0–8.0)

## 2023-07-01 LAB — URINE CULTURE
MICRO NUMBER:: 15725145
Result:: NO GROWTH
SPECIMEN QUALITY:: ADEQUATE

## 2023-07-01 LAB — CULTURE INDICATED

## 2023-09-05 ENCOUNTER — Encounter: Payer: Self-pay | Admitting: Obstetrics and Gynecology

## 2023-09-05 ENCOUNTER — Ambulatory Visit (INDEPENDENT_AMBULATORY_CARE_PROVIDER_SITE_OTHER): Payer: BC Managed Care – PPO | Admitting: Obstetrics and Gynecology

## 2023-09-05 VITALS — BP 118/64 | HR 87 | Ht 67.32 in | Wt 206.0 lb

## 2023-09-05 DIAGNOSIS — Z8619 Personal history of other infectious and parasitic diseases: Secondary | ICD-10-CM | POA: Insufficient documentation

## 2023-09-05 DIAGNOSIS — N840 Polyp of corpus uteri: Secondary | ICD-10-CM | POA: Diagnosis not present

## 2023-09-05 DIAGNOSIS — B372 Candidiasis of skin and nail: Secondary | ICD-10-CM

## 2023-09-05 DIAGNOSIS — B9689 Other specified bacterial agents as the cause of diseases classified elsewhere: Secondary | ICD-10-CM

## 2023-09-05 DIAGNOSIS — N760 Acute vaginitis: Secondary | ICD-10-CM

## 2023-09-05 DIAGNOSIS — B009 Herpesviral infection, unspecified: Secondary | ICD-10-CM

## 2023-09-05 DIAGNOSIS — Z01419 Encounter for gynecological examination (general) (routine) without abnormal findings: Secondary | ICD-10-CM | POA: Insufficient documentation

## 2023-09-05 DIAGNOSIS — N898 Other specified noninflammatory disorders of vagina: Secondary | ICD-10-CM

## 2023-09-05 LAB — WET PREP FOR TRICH, YEAST, CLUE

## 2023-09-05 MED ORDER — METRONIDAZOLE 0.75 % VA GEL: 1.0000 | Freq: Every day | VAGINAL | 0 refills | Status: AC

## 2023-09-05 MED ORDER — NYSTATIN 100000 UNIT/GM EX CREA: TOPICAL_CREAM | Freq: Two times a day (BID) | CUTANEOUS | 0 refills | Status: DC

## 2023-09-05 MED ORDER — VALACYCLOVIR HCL 500 MG PO TABS: ORAL_TABLET | ORAL | 3 refills | Status: AC

## 2023-09-05 NOTE — Patient Instructions (Signed)

## 2023-09-05 NOTE — Progress Notes (Signed)
53 y.o. [id number] female with AUB-F, Mirena IUD (inserted 10/2020), endometrial polyp on EMB (06/09/2023), HSV here for annual exam. Media planner. Pt had a hysteroscopic myomectomy, polypectomy and IUD insertion on 10/14/2020.  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 Two daughters, oldest is in [place], works in Web designer. 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  Patient's last menstrual period was 08/11/2023 (exact date). Period Duration (Days): 10-14 Period Pattern: (!) Irregular Menstrual Flow: Moderate (varies) Menstrual Control: Maxi pad, Tampon Dysmenorrhea: (!) Mild Dysmenorrhea Symptoms: Cramping  Pt reports vaginal irritation, took a diflucan - no relief. Also reports nipple irritation and burning, some scaling, used neosporin ointment.   At 06/29/2023 appointment, she noted: "Originally seen 05/12/23 with complaints of worsening AUB (longer heavier bleeding, 10d, with clots) despite Mirena IUD and desiring removal.  W/u was completed with revealed: Elevated FSH 33 and  05/17/23 TVUS: 16cm uterus (12cm in 2022) with multiple leiomyoma, largest is 6cm. 2022 [place] noted 6 fibroids, however most recent US only measured 4.  06/09/23 EMB: "Inactive endometrium with progestational changes. Fragments of benign endometrial polyp. Negative for hyperplasia or malignancy. " Pt elected to maintain IUD after completion of w/u.  Today, she still has some spotting. Pt is requesting U/A due to having some burning with urination. She states she has a hx of UTIs. "  Abnormal bleeding: as noted above Pelvic discharge or pain: none Breast mass, nipple discharge or skin changes : as noted above Birth control: Mirena Last PAP:     Component Value Date/Time   DIAGPAP  09/01/2022 1002    - Negative for Intraepithelial Lesions or Malignancy (NILM)   DIAGPAP - Benign reactive/reparative changes 09/01/2022 1002   DIAGPAP - Benign  reactive/reparative changes 08/06/2020 1014   DIAGPAP - See comment 08/06/2020 1014   DIAGPAP  08/06/2020 1014    - Negative for Intraepithelial Lesions or Malignancy (NILM)   HPVHIGH Negative 09/01/2022 1002   ADEQPAP  09/01/2022 1002    Satisfactory for evaluation; transformation zone component PRESENT.   ADEQPAP  08/06/2020 1014    Satisfactory for evaluation; transformation zone component PRESENT.   ADEQPAP  01/11/2018 0000    Satisfactory for evaluation  endocervical/transformation zone component PRESENT.   Last mammogram: 10/20/2022 BI-RADS 1, density B Last colonoscopy: 2023, 1 polyp removed, every 10 year follow-up Sexually active: Yes Exercising: Yes. Cardio and strength training  Smoker: no  GYN HISTORY: Hysteroscopic myomectomy, polypectomy and IUD insertion on 10/14/2020 H/o HSV  OB History  Gravida Para Term Preterm AB Living  3 2 2  0 1 2  SAB IAB Ectopic Multiple Live Births  0 0 0 0 2    # Outcome Date GA Lbr Len/2nd Weight Sex Type Anes PTL Lv  3 AB           2 Term     F    LIV  1 Term     F    LIV    Past Medical History:  Diagnosis Date   Abnormal pap about 2004   Abnormal uterine bleeding (AUB)    Concussion 2001   no residual   HSV infection    Asymptomatic   Hypertension    Pre-diabetes    Prediabetes    Wears glasses     Past Surgical History:  Procedure Laterality Date   CESAREAN SECTION  04/24/2003   HYSTEROSCOPY WITH D & C N/A 10/14/2020  Procedure: DILATATION AND CURETTAGE /HYSTEROSCOPY WITH MYOSURE;  Surgeon: Romualdo Bolk, MD;  Location: [facility];  Service: Gynecology;  Laterality: N/A;   INTRAUTERINE DEVICE (IUD) INSERTION N/A 10/14/2020   Procedure: INTRAUTERINE DEVICE (IUD) INSERTION;  Surgeon: Romualdo Bolk, MD;  Location: [facility];  Service: Gynecology;  Laterality: N/A;  Mirena IUD insertion   WISDOM TOOTH EXTRACTION  early 20's    Current Outpatient Medications on File Prior to  Visit  Medication Sig Dispense Refill   acetaminophen (TYLENOL) 500 MG tablet Take 1,000 mg by mouth every 6 (six) hours as needed.     amLODipine (NORVASC) 5 MG tablet 1 tablet     cetirizine (ZYRTEC) 10 MG tablet Take 10 mg by mouth at bedtime.     cholecalciferol (VITAMIN D) 1000 UNITS tablet Take 1,000 Units by mouth as needed.      levonorgestrel (MIRENA, 52 MG,) 20 MCG/24HR IUD See admin instructions.     No current facility-administered medications on file prior to visit.    Social History   Socioeconomic History   Marital status: Media planner    Spouse name: Not on file   Number of children: Not on file   Years of education: Not on file   Highest education level: Not on file  Occupational History   Not on file  Tobacco Use   Smoking status: Never   Smokeless tobacco: Never  Vaping Use   Vaping status: Never Used  Substance and Sexual Activity   Alcohol use: No   Drug use: No   Sexual activity: Yes    Partners: Male    Birth control/protection: I.U.D.  Other Topics Concern   Not on file  Social History Narrative   Not on file   Social Drivers of Health   Financial Resource Strain: Not on file  Food Insecurity: Not on file  Transportation Needs: Not on file  Physical Activity: Not on file  Stress: Not on file  Social Connections: Not on file  Intimate Partner Violence: Not on file    Family History  Problem Relation Age of Onset   Diabetes Mother    Hypertension Mother    Heart disease Mother    COPD Mother    Diabetes Father    Diabetes Maternal Grandmother    Hypertension Maternal Grandmother    Heart disease Maternal Grandmother    Heart failure Maternal Grandfather    Heart disease Maternal Grandfather    Colon cancer Paternal Uncle 31    No Known Allergies    PE Today's Vitals   09/05/23 0923  BP: 118/64  Pulse: 87  SpO2: 99%  Weight: 206 lb (93.4 kg)  Height: 5' 7.32" (1.71 m)   Body mass index is 31.96 kg/m.  Physical  Exam Vitals reviewed. Exam conducted with a chaperone present.  Constitutional:      General: She is not in acute distress.    Appearance: Normal appearance.  HENT:     Head: Normocephalic and atraumatic.     Nose: Nose normal.  Eyes:     Extraocular Movements: Extraocular movements intact.     Conjunctiva/sclera: Conjunctivae normal.  Neck:     Thyroid: No thyroid mass, thyromegaly or thyroid tenderness.  Pulmonary:     Effort: Pulmonary effort is normal.  Chest:     Chest wall: No mass or tenderness.  Breasts:    Right: Normal. No swelling, mass, nipple discharge, skin change or tenderness.     Left:  Normal. No swelling, mass, nipple discharge, skin change or tenderness.     Comments: Bilateral scabbing of both nipples Abdominal:     General: There is no distension.     Palpations: Abdomen is soft.     Tenderness: There is no abdominal tenderness.  Genitourinary:    General: Normal vulva.     Exam position: Lithotomy position.     Urethra: No prolapse.     Vagina: Vaginal discharge present. No bleeding.     Cervix: Normal. No cervical motion tenderness, discharge or lesion.     Uterus: Normal. Not enlarged and not tender.      Adnexa: Right adnexa normal and left adnexa normal.     Comments: IUD strings present. Musculoskeletal:        General: Normal range of motion.     Cervical back: Normal range of motion.  Lymphadenopathy:     Upper Body:     Right upper body: No axillary adenopathy.     Left upper body: No axillary adenopathy.     Lower Body: No right inguinal adenopathy. No left inguinal adenopathy.  Skin:    General: Skin is warm and dry.  Neurological:     General: No focal deficit present.     Mental Status: She is alert.  Psychiatric:        Mood and Affect: Mood normal.        Behavior: Behavior normal.       Assessment and Plan:        Well woman exam with routine gynecological exam Assessment & Plan: Cervical cancer screening performed  according to ASCCP guidelines. Encouraged annual mammogram screening Colonoscopy UTD DXA N/A Labs and immunizations with her primary Encouraged safe sexual practices as indicated Encouraged healthy lifestyle practices with diet and exercise For patients under 50-70yo, I recommend 1200mg  calcium daily and 600IU of vitamin D daily.    Endometrial polyp -     Ambulatory Referral For Surgery Scheduling  History of herpes genitalis -     valACYclovir HCl; Take one tablet po BID x 3 days prn  Dispense: 30 tablet; Refill: 3  Vaginal discharge -     WET PREP FOR TRICH, YEAST, CLUE  Yeast infection of the skin -     Nystatin; Apply topically 2 (two) times daily. To affected area.  Dispense: 15 g; Refill: 0  BV (bacterial vaginosis) -     metroNIDAZOLE; Place 1 Applicatorful vaginally at bedtime for 5 days.  Dispense: 50 g; Refill: 0  May benefit from vaginal estrogen if sx are not improve after treatment.  Rosalyn Gess, MD

## 2023-09-05 NOTE — Assessment & Plan Note (Signed)
 Cervical cancer screening performed according to ASCCP guidelines. Encouraged annual mammogram screening Colonoscopy UTD DXA N/A Labs and immunizations with her primary Encouraged safe sexual practices as indicated Encouraged healthy lifestyle practices with diet and exercise For patients under 50-52yo, I recommend 1200mg  calcium daily and 600IU of vitamin D daily.

## 2023-10-14 ENCOUNTER — Encounter: Payer: Self-pay | Admitting: Obstetrics and Gynecology

## 2023-10-14 ENCOUNTER — Ambulatory Visit (INDEPENDENT_AMBULATORY_CARE_PROVIDER_SITE_OTHER): Payer: BC Managed Care – PPO | Admitting: Obstetrics and Gynecology

## 2023-10-14 VITALS — BP 122/72 | HR 85 | Temp 97.7°F | Ht 67.25 in | Wt 201.0 lb

## 2023-10-14 DIAGNOSIS — N840 Polyp of corpus uteri: Secondary | ICD-10-CM

## 2023-10-14 DIAGNOSIS — N939 Abnormal uterine and vaginal bleeding, unspecified: Secondary | ICD-10-CM

## 2023-10-14 NOTE — H&P (View-Only) (Signed)
 53 y.o. [id number] female with AUB-F, Mirena IUD (inserted 10/2020), endometrial polyp on EMB (06/09/2023), HSV here for surgical consultation for diagnostic hysteroscopy, dilation and curettage, polypectomy.  Media planner. Pt had a hysteroscopic myomectomy, polypectomy and IUD insertion on 10/14/2020.  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 Two daughters, oldest is in [place], works in Web designer. 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  Patient's last menstrual period was 10/04/2023 (approximate). Period Duration (Days): 10-14 Period Pattern: Regular Menstrual Flow: Moderate Menstrual Control: Maxi pad, Tampon Dysmenorrhea: (!) Mild  At 06/29/2023 appointment, she noted: "Originally seen 05/12/23 with complaints of worsening AUB (longer heavier bleeding, 10d, with clots) despite Mirena IUD and desiring removal.  W/u was completed with revealed: Elevated FSH 33 and  05/17/23 TVUS: 16cm uterus (12cm in 2022) with multiple leiomyoma, largest is 6cm. 2022 [place] noted 6 fibroids, however most recent US only measured 4.  06/09/23 EMB: "Inactive endometrium with progestational changes. Fragments of benign endometrial polyp. Negative for hyperplasia or malignancy. " Pt elected to maintain IUD after completion of w/u.  Today, she is doing well. No complaints.  Last PAP:    Component Value Date/Time   DIAGPAP  09/01/2022 1002    - Negative for Intraepithelial Lesions or Malignancy (NILM)   DIAGPAP - Benign reactive/reparative changes 09/01/2022 1002   DIAGPAP - Benign reactive/reparative changes 08/06/2020 1014   DIAGPAP - See comment 08/06/2020 1014   DIAGPAP  08/06/2020 1014    - Negative for Intraepithelial Lesions or Malignancy (NILM)   HPVHIGH Negative 09/01/2022 1002   ADEQPAP  09/01/2022 1002    Satisfactory for evaluation; transformation zone component PRESENT.   ADEQPAP  08/06/2020 1014    Satisfactory for  evaluation; transformation zone component PRESENT.   ADEQPAP  01/11/2018 0000    Satisfactory for evaluation  endocervical/transformation zone component PRESENT.   GYN HISTORY: Hysteroscopic myomectomy, polypectomy and IUD insertion on 10/14/2020 H/o HSV  OB History  Gravida Para Term Preterm AB Living  3 2 2  0 1 2  SAB IAB Ectopic Multiple Live Births  0 0 0 0 2    # Outcome Date GA Lbr Len/2nd Weight Sex Type Anes PTL Lv  3 AB           2 Term     F    LIV  1 Term     F    LIV    Past Medical History:  Diagnosis Date   Abnormal pap about 2004   Abnormal uterine bleeding (AUB)    Concussion 2001   no residual   HSV infection    Asymptomatic   Hypertension    Pre-diabetes    Prediabetes    Wears glasses     Past Surgical History:  Procedure Laterality Date   CESAREAN SECTION  04/24/2003   HYSTEROSCOPY WITH D & C N/A 10/14/2020   Procedure: DILATATION AND CURETTAGE /HYSTEROSCOPY WITH MYOSURE;  Surgeon: Romualdo Bolk, MD;  Location: [facility] [place];  Service: Gynecology;  Laterality: N/A;   INTRAUTERINE DEVICE (IUD) INSERTION N/A 10/14/2020   Procedure: INTRAUTERINE DEVICE (IUD) INSERTION;  Surgeon: Romualdo Bolk, MD;  Location: [facility];  Service: Gynecology;  Laterality: N/A;  Mirena IUD insertion   WISDOM TOOTH EXTRACTION  early 20's    Current Outpatient Medications on File Prior to Visit  Medication Sig Dispense Refill   acetaminophen (TYLENOL) 500 MG tablet Take 1,000  mg by mouth every 6 (six) hours as needed.     amLODipine (NORVASC) 5 MG tablet 1 tablet     cetirizine (ZYRTEC) 10 MG tablet Take 10 mg by mouth at bedtime.     cholecalciferol (VITAMIN D) 1000 UNITS tablet Take 1,000 Units by mouth as needed.      levonorgestrel (MIRENA, 52 MG,) 20 MCG/24HR IUD See admin instructions.     valACYclovir (VALTREX) 500 MG tablet Take one tablet po BID x 3 days prn 30 tablet 3   No current facility-administered medications on  file prior to visit.    No Known Allergies    PE Today's Vitals   10/14/23 0853  BP: 122/72  Pulse: 85  Temp: 97.7 F (36.5 C)  TempSrc: Oral  SpO2: 99%  Weight: 201 lb (91.2 kg)  Height: 5' 7.25" (1.708 m)   Body mass index is 31.25 kg/m.  Physical Exam Vitals reviewed.  Constitutional:      General: She is not in acute distress.    Appearance: Normal appearance.  HENT:     Head: Normocephalic and atraumatic.     Nose: Nose normal.  Eyes:     Extraocular Movements: Extraocular movements intact.     Conjunctiva/sclera: Conjunctivae normal.  Cardiovascular:     Rate and Rhythm: Normal rate and regular rhythm.     Heart sounds: No murmur heard.    No friction rub. No gallop.  Pulmonary:     Effort: Pulmonary effort is normal. No respiratory distress.     Breath sounds: Normal breath sounds. No stridor. No wheezing, rhonchi or rales.  Musculoskeletal:        General: Normal range of motion.     Cervical back: Normal range of motion.  Neurological:     General: No focal deficit present.     Mental Status: She is alert.  Psychiatric:        Mood and Affect: Mood normal.        Behavior: Behavior normal.       Assessment and Plan:        Endometrial polyp Assessment & Plan: Originally seen 05/12/23 with complaints of worsening AUB (longer heavier bleeding, 10d, with clots) despite Mirena IUD and desiring removal. Of note, she had a hysteroscopic myomectomy, polypectomy and IUD insertion on 10/14/2020.    W/u was completed with revealed: Elevated FSH 33 and  05/17/23 TVUS: 16cm uterus (12cm in 2022) with multiple leiomyoma, largest is 6cm. 2022 [place] noted 6 fibroids, however most recent US only measured 4.  06/09/23 EMB: "Inactive endometrium with progestational changes. Fragments of benign endometrial polyp. Negative for hyperplasia or malignancy. " Pt elected to maintain IUD after completion of w/u.  Plan for hysteroscopy, dilation and curettage,  polypectomy.  Discussed outpatient procedure. Reviewed that  recovery is usually 1-2 days. Risks including infections, bleeding, and damage to surrounding organs reviewed. Recommend NPO prior to midnight and reviewed medication to take on day of surgery. Dicussed use of NSAIDS as needed for pain postoperatively.  Preop checklist: Antibiotics: none DVT ppx: SCDs Postop visit: 1 week Additional clearance: medical clearance received 09/19/23 fromn PCP     Abnormal uterine bleeding (AUB) Assessment & Plan: See above      Rosalyn Gess, MD

## 2023-10-14 NOTE — Progress Notes (Signed)
 53 y.o. [id number] female with AUB-F, Mirena IUD (inserted 10/2020), endometrial polyp on EMB (06/09/2023), HSV here for surgical consultation for diagnostic hysteroscopy, dilation and curettage, polypectomy.  Media planner. Pt had a hysteroscopic myomectomy, polypectomy and IUD insertion on 10/14/2020.  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 Two daughters, oldest is in [place], works in Web designer. 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  Patient's last menstrual period was 10/04/2023 (approximate). Period Duration (Days): 10-14 Period Pattern: Regular Menstrual Flow: Moderate Menstrual Control: Maxi pad, Tampon Dysmenorrhea: (!) Mild  At 06/29/2023 appointment, she noted: "Originally seen 05/12/23 with complaints of worsening AUB (longer heavier bleeding, 10d, with clots) despite Mirena IUD and desiring removal.  W/u was completed with revealed: Elevated FSH 33 and  05/17/23 TVUS: 16cm uterus (12cm in 2022) with multiple leiomyoma, largest is 6cm. 2022 [place] noted 6 fibroids, however most recent US only measured 4.  06/09/23 EMB: "Inactive endometrium with progestational changes. Fragments of benign endometrial polyp. Negative for hyperplasia or malignancy. " Pt elected to maintain IUD after completion of w/u.  Today, she is doing well. No complaints.  Last PAP:    Component Value Date/Time   DIAGPAP  09/01/2022 1002    - Negative for Intraepithelial Lesions or Malignancy (NILM)   DIAGPAP - Benign reactive/reparative changes 09/01/2022 1002   DIAGPAP - Benign reactive/reparative changes 08/06/2020 1014   DIAGPAP - See comment 08/06/2020 1014   DIAGPAP  08/06/2020 1014    - Negative for Intraepithelial Lesions or Malignancy (NILM)   HPVHIGH Negative 09/01/2022 1002   ADEQPAP  09/01/2022 1002    Satisfactory for evaluation; transformation zone component PRESENT.   ADEQPAP  08/06/2020 1014    Satisfactory for  evaluation; transformation zone component PRESENT.   ADEQPAP  01/11/2018 0000    Satisfactory for evaluation  endocervical/transformation zone component PRESENT.   GYN HISTORY: Hysteroscopic myomectomy, polypectomy and IUD insertion on 10/14/2020 H/o HSV  OB History  Gravida Para Term Preterm AB Living  3 2 2  0 1 2  SAB IAB Ectopic Multiple Live Births  0 0 0 0 2    # Outcome Date GA Lbr Len/2nd Weight Sex Type Anes PTL Lv  3 AB           2 Term     F    LIV  1 Term     F    LIV    Past Medical History:  Diagnosis Date   Abnormal pap about 2004   Abnormal uterine bleeding (AUB)    Concussion 2001   no residual   HSV infection    Asymptomatic   Hypertension    Pre-diabetes    Prediabetes    Wears glasses     Past Surgical History:  Procedure Laterality Date   CESAREAN SECTION  04/24/2003   HYSTEROSCOPY WITH D & C N/A 10/14/2020   Procedure: DILATATION AND CURETTAGE /HYSTEROSCOPY WITH MYOSURE;  Surgeon: Romualdo Bolk, MD;  Location: [facility] [place];  Service: Gynecology;  Laterality: N/A;   INTRAUTERINE DEVICE (IUD) INSERTION N/A 10/14/2020   Procedure: INTRAUTERINE DEVICE (IUD) INSERTION;  Surgeon: Romualdo Bolk, MD;  Location: [facility] [place];  Service: Gynecology;  Laterality: N/A;  Mirena IUD insertion   WISDOM TOOTH EXTRACTION  early 20's    Current Outpatient Medications on File Prior to Visit  Medication Sig Dispense Refill   acetaminophen (TYLENOL) 500 MG tablet Take 1,000  mg by mouth every 6 (six) hours as needed.     amLODipine (NORVASC) 5 MG tablet 1 tablet     cetirizine (ZYRTEC) 10 MG tablet Take 10 mg by mouth at bedtime.     cholecalciferol (VITAMIN D) 1000 UNITS tablet Take 1,000 Units by mouth as needed.      levonorgestrel (MIRENA, 52 MG,) 20 MCG/24HR IUD See admin instructions.     valACYclovir (VALTREX) 500 MG tablet Take one tablet po BID x 3 days prn 30 tablet 3   No current facility-administered medications on  file prior to visit.    No Known Allergies    PE Today's Vitals   10/14/23 0853  BP: 122/72  Pulse: 85  Temp: 97.7 F (36.5 C)  TempSrc: Oral  SpO2: 99%  Weight: 201 lb (91.2 kg)  Height: 5' 7.25" (1.708 m)   Body mass index is 31.25 kg/m.  Physical Exam Vitals reviewed.  Constitutional:      General: She is not in acute distress.    Appearance: Normal appearance.  HENT:     Head: Normocephalic and atraumatic.     Nose: Nose normal.  Eyes:     Extraocular Movements: Extraocular movements intact.     Conjunctiva/sclera: Conjunctivae normal.  Cardiovascular:     Rate and Rhythm: Normal rate and regular rhythm.     Heart sounds: No murmur heard.    No friction rub. No gallop.  Pulmonary:     Effort: Pulmonary effort is normal. No respiratory distress.     Breath sounds: Normal breath sounds. No stridor. No wheezing, rhonchi or rales.  Musculoskeletal:        General: Normal range of motion.     Cervical back: Normal range of motion.  Neurological:     General: No focal deficit present.     Mental Status: She is alert.  Psychiatric:        Mood and Affect: Mood normal.        Behavior: Behavior normal.       Assessment and Plan:        Endometrial polyp Assessment & Plan: Originally seen 05/12/23 with complaints of worsening AUB (longer heavier bleeding, 10d, with clots) despite Mirena IUD and desiring removal. Of note, she had a hysteroscopic myomectomy, polypectomy and IUD insertion on 10/14/2020.    W/u was completed with revealed: Elevated FSH 33 and  05/17/23 TVUS: 16cm uterus (12cm in 2022) with multiple leiomyoma, largest is 6cm. 2022 [place] noted 6 fibroids, however most recent US only measured 4.  06/09/23 EMB: "Inactive endometrium with progestational changes. Fragments of benign endometrial polyp. Negative for hyperplasia or malignancy. " Pt elected to maintain IUD after completion of w/u.  Plan for hysteroscopy, dilation and curettage,  polypectomy.  Discussed outpatient procedure. Reviewed that  recovery is usually 1-2 days. Risks including infections, bleeding, and damage to surrounding organs reviewed. Recommend NPO prior to midnight and reviewed medication to take on day of surgery. Dicussed use of NSAIDS as needed for pain postoperatively.  Preop checklist: Antibiotics: none DVT ppx: SCDs Postop visit: 1 week Additional clearance: medical clearance received 09/19/23 fromn PCP     Abnormal uterine bleeding (AUB) Assessment & Plan: See above      Rosalyn Gess, MD

## 2023-10-14 NOTE — Assessment & Plan Note (Addendum)
 Originally seen 05/12/23 with complaints of worsening AUB (longer heavier bleeding, 10d, with clots) despite Mirena IUD and desiring removal. Of note, she had a hysteroscopic myomectomy, polypectomy and IUD insertion on 10/14/2020.    W/u was completed with revealed: Elevated FSH 33 and  05/17/23 TVUS: 16cm uterus (12cm in 2022) with multiple leiomyoma, largest is 6cm. 2022 [place] noted 6 fibroids, however most recent US only measured 4.  06/09/23 EMB: "Inactive endometrium with progestational changes. Fragments of benign endometrial polyp. Negative for hyperplasia or malignancy. " Pt elected to maintain IUD after completion of w/u.  Plan for hysteroscopy, dilation and curettage, polypectomy.  Discussed outpatient procedure. Reviewed that  recovery is usually 1-2 days. Risks including infections, bleeding, and damage to surrounding organs reviewed. Recommend NPO prior to midnight and reviewed medication to take on day of surgery. Dicussed use of NSAIDS as needed for pain postoperatively.  Preop checklist: Antibiotics: none DVT ppx: SCDs Postop visit: 1 week Additional clearance: medical clearance received 09/19/23 fromn PCP

## 2023-10-14 NOTE — Assessment & Plan Note (Signed)
 See above

## 2023-10-21 ENCOUNTER — Ambulatory Visit
Admission: RE | Admit: 2023-10-21 | Discharge: 2023-10-21 | Disposition: A | Discharge status: Home / Self Care | Payer: BC Managed Care – PPO | Source: Ambulatory Visit | Attending: Family Medicine | Admitting: Family Medicine

## 2023-10-21 DIAGNOSIS — Z1231 Encounter for screening mammogram for malignant neoplasm of breast: Secondary | ICD-10-CM

## 2023-11-02 ENCOUNTER — Encounter: Payer: Self-pay | Admitting: *Deleted

## 2023-11-07 ENCOUNTER — Other Ambulatory Visit: Payer: Self-pay

## 2023-11-07 ENCOUNTER — Encounter (HOSPITAL_COMMUNITY): Payer: Self-pay | Admitting: Obstetrics and Gynecology

## 2023-11-07 NOTE — Progress Notes (Signed)
 PCP - Jarrett Soho, PA-C  Cardiologist -   PPM/ICD - denies Device Orders - n/a Rep Notified - n/a  Chest x-ray -  EKG -  Stress Test -  ECHO -  Cardiac Cath -   CPAP - denies  Dm -denies  Blood Thinner Instructions: denies Aspirin Instructions: n/a  ERAS Protcol - Clear liquids until 8:30  COVID TEST- n/a  Anesthesia review: no  Patient verbally denies any shortness of breath, fever, cough and chest pain during phone call   -------------  SDW INSTRUCTIONS given:  Your procedure is scheduled on November 09, 2023.  Report to [facility] Main Entrance "A" at 9:00 A.M., and check in at the Admitting office.  Call this number if you have problems the morning of surgery:  [phone]   Remember:  Do not eat after midnight the night before your surgery  You may drink clear liquids until 8:30 the morning of your surgery.   Clear liquids allowed are: Water, Non-Citrus Juices (without pulp), Carbonated Beverages, Clear Tea, Black Coffee Only, and Gatorade    Take these medicines the morning of surgery with A SIP OF WATER  acetaminophen (TYLENOL)  amLODipine (NORVASC)  valACYclovir (VALTREX)      As of today, STOP taking any Aspirin (unless otherwise instructed by your surgeon) Aleve, Naproxen, Ibuprofen, Motrin, Advil, Goody's, BC's, all herbal medications, fish oil, and all vitamins.                      Do not wear jewelry, make up, or nail polish            Do not wear lotions, powders, perfumes/colognes, or deodorant.            Do not shave 48 hours prior to surgery.  Men may shave face and neck.            Do not bring valuables to the hospital.            [facility] is not responsible for any belongings or valuables.  Do NOT Smoke (Tobacco/Vaping) 24 hours prior to your procedure If you use a CPAP at night, you may bring all equipment for your overnight stay.   Contacts, glasses, dentures or bridgework may not be worn into surgery.      For patients  admitted to the hospital, discharge time will be determined by your treatment team.   Patients discharged the day of surgery will not be allowed to drive home, and someone needs to stay with them for 24 hours.    Special instructions:   [employer]- Preparing For Surgery  Before surgery, you can play an important role. Because skin is not sterile, your skin needs to be as free of germs as possible. You can reduce the number of germs on your skin by washing with CHG (chlorahexidine gluconate) Soap before surgery.  CHG is an antiseptic cleaner which kills germs and bonds with the skin to continue killing germs even after washing.    Oral Hygiene is also important to reduce your risk of infection.  Remember - BRUSH YOUR TEETH THE MORNING OF SURGERY WITH YOUR REGULAR TOOTHPASTE  Please do not use if you have an allergy to CHG or antibacterial soaps. If your skin becomes reddened/irritated stop using the CHG.  Do not shave (including legs and underarms) for at least 48 hours prior to first CHG shower. It is OK to shave your face.  Please follow these instructions carefully.  Shower the NIGHT BEFORE SURGERY and the MORNING OF SURGERY with DIAL Soap.   Pat yourself dry with a CLEAN TOWEL.  Wear CLEAN PAJAMAS to bed the night before surgery  Place CLEAN SHEETS on your bed the night of your first shower and DO NOT SLEEP WITH PETS.   Day of Surgery: Please shower morning of surgery  Wear Clean/Comfortable clothing the morning of surgery Do not apply any deodorants/lotions.   Remember to brush your teeth WITH YOUR REGULAR TOOTHPASTE.   Questions were answered. Patient verbalized understanding of instructions.

## 2023-11-07 NOTE — Anesthesia Preprocedure Evaluation (Signed)
 Anesthesia Evaluation  Patient identified by MRN, date of birth, ID band Patient awake    Reviewed: Allergy & Precautions, NPO status , Patient's Chart, lab work & pertinent test results  Airway Mallampati: II  TM Distance: >3 FB Neck ROM: Full    Dental  (+) Teeth Intact, Dental Advisory Given   Pulmonary neg pulmonary ROS   Pulmonary exam normal breath sounds clear to auscultation       Cardiovascular hypertension, Pt. on medications Normal cardiovascular exam Rhythm:Regular Rate:Normal     Neuro/Psych negative neurological ROS  negative psych ROS   GI/Hepatic negative GI ROS, Neg liver ROS,,,  Endo/Other  diabetes (prediabetic), Well Controlled    Renal/GU negative Renal ROS  negative genitourinary   Musculoskeletal negative musculoskeletal ROS (+)    Abdominal  (+) + obese  Peds  Hematology negative hematology ROS (+)   Anesthesia Other Findings   Reproductive/Obstetrics negative OB ROS                              Anesthesia Physical Anesthesia Plan  ASA: 2  Anesthesia Plan: General   Post-op Pain Management: Tylenol PO (pre-op)* and Toradol IV (intra-op)*   Induction: Intravenous  PONV Risk Score and Plan: 3 and Ondansetron, Dexamethasone, Midazolam and Treatment may vary due to age or medical condition  Airway Management Planned: LMA  Additional Equipment: None  Intra-op Plan:   Post-operative Plan: Extubation in OR  Informed Consent: I have reviewed the patients History and Physical, chart, labs and discussed the procedure including the risks, benefits and alternatives for the proposed anesthesia with the patient or authorized representative who has indicated his/her understanding and acceptance.     Dental advisory given  Plan Discussed with: Anesthesiologist and CRNA  Anesthesia Plan Comments:         Anesthesia Quick Evaluation

## 2023-11-09 ENCOUNTER — Ambulatory Visit (HOSPITAL_COMMUNITY)
Admission: RE | Admit: 2023-11-09 | Discharge: 2023-11-09 | Disposition: A | Discharge status: Home / Self Care | Payer: BC Managed Care – PPO | Attending: Obstetrics and Gynecology | Admitting: Obstetrics and Gynecology

## 2023-11-09 ENCOUNTER — Other Ambulatory Visit: Payer: Self-pay

## 2023-11-09 ENCOUNTER — Ambulatory Visit (HOSPITAL_COMMUNITY): Payer: Self-pay | Admitting: Anesthesiology

## 2023-11-09 ENCOUNTER — Encounter (HOSPITAL_COMMUNITY): Payer: Self-pay | Admitting: Obstetrics and Gynecology

## 2023-11-09 ENCOUNTER — Encounter (HOSPITAL_COMMUNITY)
Admission: RE | Disposition: A | Discharge status: Home / Self Care | Payer: Self-pay | Source: Home / Self Care | Attending: Obstetrics and Gynecology

## 2023-11-09 DIAGNOSIS — Z793 Long term (current) use of hormonal contraceptives: Secondary | ICD-10-CM | POA: Insufficient documentation

## 2023-11-09 DIAGNOSIS — D259 Leiomyoma of uterus, unspecified: Secondary | ICD-10-CM | POA: Diagnosis not present

## 2023-11-09 DIAGNOSIS — N939 Abnormal uterine and vaginal bleeding, unspecified: Secondary | ICD-10-CM | POA: Diagnosis not present

## 2023-11-09 DIAGNOSIS — N938 Other specified abnormal uterine and vaginal bleeding: Secondary | ICD-10-CM | POA: Diagnosis not present

## 2023-11-09 DIAGNOSIS — N840 Polyp of corpus uteri: Secondary | ICD-10-CM | POA: Diagnosis not present

## 2023-11-09 DIAGNOSIS — Z6830 Body mass index (BMI) 30.0-30.9, adult: Secondary | ICD-10-CM | POA: Insufficient documentation

## 2023-11-09 DIAGNOSIS — R7303 Prediabetes: Secondary | ICD-10-CM | POA: Diagnosis not present

## 2023-11-09 DIAGNOSIS — E669 Obesity, unspecified: Secondary | ICD-10-CM | POA: Diagnosis not present

## 2023-11-09 DIAGNOSIS — I10 Essential (primary) hypertension: Secondary | ICD-10-CM | POA: Diagnosis not present

## 2023-11-09 HISTORY — PX: DILATATION & CURETTAGE/HYSTEROSCOPY WITH MYOSURE: SHX6511

## 2023-11-09 LAB — BASIC METABOLIC PANEL
Anion gap: 10 (ref 5–15)
BUN: 9 mg/dL (ref 6–20)
CO2: 23 mmol/L (ref 22–32)
Calcium: 9 mg/dL (ref 8.9–10.3)
Chloride: 106 mmol/L (ref 98–111)
Creatinine, Ser: 0.79 mg/dL (ref 0.44–1.00)
GFR, Estimated: >60 mL/min (ref >60)
Glucose, Bld: 97 mg/dL (ref 70–99)
Potassium: 3.8 mmol/L (ref 3.5–5.1)
Sodium: 139 mmol/L (ref 135–145)

## 2023-11-09 LAB — CBC
HCT: 40.6 % (ref 36.0–46.0)
Hemoglobin: 13.6 g/dL (ref 12.0–15.0)
MCH: 30.5 pg (ref 26.0–34.0)
MCHC: 33.5 g/dL (ref 30.0–36.0)
MCV: 91 fL (ref 80.0–100.0)
Platelets: 198 10*3/uL (ref 150–400)
RBC: 4.46 MIL/uL (ref 3.87–5.11)
RDW: 12.3 % (ref 11.5–15.5)
WBC: 5.3 10*3/uL (ref 4.0–10.5)
nRBC: 0 % (ref 0.0–0.2)

## 2023-11-09 LAB — POCT PREGNANCY, URINE: Preg Test, Ur: NEGATIVE

## 2023-11-09 SURGERY — DILATATION & CURETTAGE/HYSTEROSCOPY WITH MYOSURE
Anesthesia: General | Site: Uterus

## 2023-11-09 MED ORDER — SODIUM CHLORIDE (PF) 0.9 % IJ SOLN
INTRAMUSCULAR | Status: AC
  Filled 2023-11-09: qty 50

## 2023-11-09 MED ORDER — LIDOCAINE 2% (20 MG/ML) 5 ML SYRINGE
INTRAMUSCULAR | Status: AC
Start: 2023-11-09 — End: ?
  Filled 2023-11-09: qty 10

## 2023-11-09 MED ORDER — SCOPOLAMINE 1 MG/3DAYS TD PT72
1.0000 | MEDICATED_PATCH | TRANSDERMAL | Status: DC
  Administered 2023-11-09: 1.5 mg via TRANSDERMAL
  Filled 2023-11-09: qty 1

## 2023-11-09 MED ORDER — CHLORHEXIDINE GLUCONATE 0.12 % MT SOLN: 15.0000 mL | Freq: Once | OROMUCOSAL | Status: AC

## 2023-11-09 MED ORDER — ORAL CARE MOUTH RINSE: 15.0000 mL | Freq: Once | OROMUCOSAL | Status: AC

## 2023-11-09 MED ORDER — MIDAZOLAM HCL 2 MG/2ML IJ SOLN
INTRAMUSCULAR | Status: DC | PRN
  Administered 2023-11-09: 2 mg via INTRAVENOUS

## 2023-11-09 MED ORDER — LIDOCAINE HCL 1 % IJ SOLN
INTRAMUSCULAR | Status: AC
  Filled 2023-11-09: qty 20

## 2023-11-09 MED ORDER — ACETAMINOPHEN 500 MG PO TABS
1000.0000 mg | ORAL_TABLET | ORAL | Status: AC
  Administered 2023-11-09: 1000 mg via ORAL
  Filled 2023-11-09: qty 2

## 2023-11-09 MED ORDER — PHENYLEPHRINE 80 MCG/ML (10ML) SYRINGE FOR IV PUSH (FOR BLOOD PRESSURE SUPPORT)
PREFILLED_SYRINGE | INTRAVENOUS | Status: AC
  Filled 2023-11-09: qty 10

## 2023-11-09 MED ORDER — EPHEDRINE 5 MG/ML INJ
INTRAVENOUS | Status: AC
  Filled 2023-11-09: qty 5

## 2023-11-09 MED ORDER — ACETAMINOPHEN 500 MG PO TABS: 1000.0000 mg | ORAL_TABLET | Freq: Once | ORAL | Status: DC

## 2023-11-09 MED ORDER — ATROPINE SULFATE 0.4 MG/ML IV SOLN
INTRAVENOUS | Status: AC
Start: 2023-11-09 — End: ?
  Filled 2023-11-09: qty 1

## 2023-11-09 MED ORDER — PROPOFOL 10 MG/ML IV BOLUS
INTRAVENOUS | Status: AC
  Filled 2023-11-09: qty 20

## 2023-11-09 MED ORDER — LIDOCAINE HCL (PF) 1 % IJ SOLN
INTRAMUSCULAR | Status: DC | PRN
  Administered 2023-11-09: 10 mL

## 2023-11-09 MED ORDER — ROCURONIUM BROMIDE 10 MG/ML (PF) SYRINGE
PREFILLED_SYRINGE | INTRAVENOUS | Status: AC
  Filled 2023-11-09: qty 10

## 2023-11-09 MED ORDER — SILVER NITRATE-POT NITRATE 75-25 % EX MISC
CUTANEOUS | Status: AC
  Filled 2023-11-09: qty 10

## 2023-11-09 MED ORDER — SILVER NITRATE-POT NITRATE 75-25 % EX MISC
CUTANEOUS | Status: DC | PRN
  Administered 2023-11-09: 2

## 2023-11-09 MED ORDER — DEXAMETHASONE SODIUM PHOSPHATE 10 MG/ML IJ SOLN
INTRAMUSCULAR | Status: DC | PRN
  Administered 2023-11-09: 10 mg via INTRAVENOUS

## 2023-11-09 MED ORDER — FENTANYL CITRATE (PF) 250 MCG/5ML IJ SOLN
INTRAMUSCULAR | Status: AC
  Filled 2023-11-09: qty 5

## 2023-11-09 MED ORDER — ONDANSETRON HCL 4 MG/2ML IJ SOLN
INTRAMUSCULAR | Status: AC
  Filled 2023-11-09: qty 6

## 2023-11-09 MED ORDER — KETOROLAC TROMETHAMINE 30 MG/ML IJ SOLN
INTRAMUSCULAR | Status: DC | PRN
  Administered 2023-11-09: 30 mg via INTRAVENOUS

## 2023-11-09 MED ORDER — MIDAZOLAM HCL 2 MG/2ML IJ SOLN
INTRAMUSCULAR | Status: AC
  Filled 2023-11-09: qty 2

## 2023-11-09 MED ORDER — ONDANSETRON HCL 4 MG/2ML IJ SOLN
INTRAMUSCULAR | Status: DC | PRN
  Administered 2023-11-09: 4 mg via INTRAVENOUS

## 2023-11-09 MED ORDER — LIDOCAINE 2% (20 MG/ML) 5 ML SYRINGE
INTRAMUSCULAR | Status: DC | PRN
Start: 2023-11-09 — End: 2023-11-09
  Administered 2023-11-09: 60 mg via INTRAVENOUS

## 2023-11-09 MED ORDER — DEXAMETHASONE SODIUM PHOSPHATE 10 MG/ML IJ SOLN
INTRAMUSCULAR | Status: AC
Start: 2023-11-09 — End: ?
  Filled 2023-11-09: qty 3

## 2023-11-09 MED ORDER — PROPOFOL 10 MG/ML IV BOLUS
INTRAVENOUS | Status: DC | PRN
  Administered 2023-11-09: 150 mg via INTRAVENOUS

## 2023-11-09 MED ORDER — LACTATED RINGERS IV SOLN
INTRAVENOUS | Status: DC
Start: 2023-11-09 — End: 2023-11-09

## 2023-11-09 MED ORDER — CHLORHEXIDINE GLUCONATE 0.12 % MT SOLN
OROMUCOSAL | Status: AC
Start: 2023-11-09 — End: 2023-11-09
  Administered 2023-11-09: 15 mL via OROMUCOSAL
  Filled 2023-11-09: qty 15

## 2023-11-09 MED ORDER — FENTANYL CITRATE (PF) 250 MCG/5ML IJ SOLN
INTRAMUSCULAR | Status: DC | PRN
  Administered 2023-11-09: 50 ug via INTRAVENOUS

## 2023-11-09 MED ORDER — KETOROLAC TROMETHAMINE 30 MG/ML IJ SOLN
INTRAMUSCULAR | Status: AC
Start: 2023-11-09 — End: ?
  Filled 2023-11-09: qty 2

## 2023-11-09 MED ORDER — LACTATED RINGERS IV SOLN: INTRAVENOUS | Status: DC

## 2023-11-09 SURGICAL SUPPLY — 18 items
DEVICE MYOSURE LITE (MISCELLANEOUS) IMPLANT
DEVICE MYOSURE REACH (MISCELLANEOUS) IMPLANT
DILATOR CANAL MILEX (MISCELLANEOUS) IMPLANT
GAUZE 4X4 16PLY ~~LOC~~+RFID DBL (SPONGE) IMPLANT
GLOVE BIO SURGEON STRL SZ7 (GLOVE) ×1 IMPLANT
GLOVE BIOGEL PI IND STRL 7.0 (GLOVE) ×1 IMPLANT
GOWN STRL REUS W/ TWL XL LVL3 (GOWN DISPOSABLE) ×1 IMPLANT
HIBICLENS CHG 4% 4OZ BTL (MISCELLANEOUS) ×1 IMPLANT
KIT PROCEDURE FLUENT (KITS) ×1 IMPLANT
KIT TURNOVER KIT B (KITS) ×1 IMPLANT
MYOSURE XL FIBROID (MISCELLANEOUS) IMPLANT
PACK VAGINAL MINOR WOMEN LF (CUSTOM PROCEDURE TRAY) ×1 IMPLANT
PAD OB MATERNITY 11 LF (PERSONAL CARE ITEMS) ×1 IMPLANT
PAD PREP 24X48 CUFFED NSTRL (MISCELLANEOUS) ×1 IMPLANT
SEAL ROD LENS SCOPE MYOSURE (ABLATOR) ×1 IMPLANT
SOL .9 NS 3000ML IRR UROMATIC (IV SOLUTION) ×1 IMPLANT
SYSTEM TISS REMOVAL MYOSURE XL (MISCELLANEOUS) IMPLANT
TOWEL OR 17X24 6PK STRL BLUE (TOWEL DISPOSABLE) ×1 IMPLANT

## 2023-11-09 NOTE — Anesthesia Procedure Notes (Signed)
 Procedure Name: LMA Insertion Date/Time: 11/09/2023 1:54 PM  Performed by: Alease Medina, CRNAPre-anesthesia Checklist: Patient identified, Emergency Drugs available, Suction available and Patient being monitored Patient Re-evaluated:Patient Re-evaluated prior to induction Oxygen Delivery Method: Circle system utilized Preoxygenation: Pre-oxygenation with 100% oxygen Induction Type: IV induction Ventilation: Mask ventilation without difficulty LMA: LMA inserted LMA Size: 4.0 Tube type: Oral Number of attempts: 1 Airway Equipment and Method: Stylet and Oral airway Placement Confirmation: ETT inserted through vocal cords under direct vision, positive ETCO2 and breath sounds checked- equal and bilateral Tube secured with: Tape Dental Injury: Teeth and Oropharynx as per pre-operative assessment

## 2023-11-09 NOTE — Anesthesia Postprocedure Evaluation (Signed)
 Anesthesia Post Note  Patient: Sarah Keller  Procedure(s) Performed: DILATATION & CURETTAGE/HYSTEROSCOPY WITH MYOSURE (Uterus)     Patient location during evaluation: PACU Anesthesia Type: General Level of consciousness: awake and alert and oriented Pain management: pain level controlled Vital Signs Assessment: post-procedure vital signs reviewed and stable Respiratory status: spontaneous breathing, nonlabored ventilation and respiratory function stable Cardiovascular status: blood pressure returned to baseline and stable Postop Assessment: no apparent nausea or vomiting Anesthetic complications: no   No notable events documented.  Last Vitals:  Vitals:   11/09/23 1445 11/09/23 1500  BP: (!) 148/83 (!) 157/92  Pulse: 76 63  Resp: 18 14  Temp:  36.7 C  SpO2: 99% 99%    Last Pain:  Vitals:   11/09/23 0953  TempSrc:   PainSc: 0-No pain                 Frankey Botting A.

## 2023-11-09 NOTE — Transfer of Care (Signed)
 Immediate Anesthesia Transfer of Care Note  Patient: Sarah Keller  Procedure(s) Performed: DILATATION & CURETTAGE/HYSTEROSCOPY WITH MYOSURE (Uterus)  Patient Location: PACU  Anesthesia Type:General  Level of Consciousness: awake and alert   Airway & Oxygen Therapy: Patient Spontanous Breathing  Post-op Assessment: Report given to RN and Post -op Vital signs reviewed and stable  Post vital signs: Reviewed and stable  Last Vitals:  Vitals Value Taken Time  BP 141/73 11/09/23 1435  Temp    Pulse 85 11/09/23 1438  Resp 15 11/09/23 1438  SpO2 99 % 11/09/23 1438  Vitals shown include unfiled device data.  Last Pain:  Vitals:   11/09/23 0953  TempSrc:   PainSc: 0-No pain         Complications: No notable events documented.

## 2023-11-09 NOTE — Interval H&P Note (Signed)
 Date of Initial H&P: 10/14/2023  History reviewed, patient examined, no change in status, stable for surgery.

## 2023-11-09 NOTE — Op Note (Signed)
 11/09/2023 Sarah Keller [id number]  OPERATIVE REPORT  Preop Diagnosis: AUB, endometrial polyp  Post operative diagnosis: same  Procedure: Diagnostic hysteroscopy, dilation and curettage, hysteroscopic myomectomy  Surgeon: Rosalyn Gess, MD Assistant: none  Anesthesia: MAC Fluids: please see anesthesia report Fluid deficit: 535 cc  Complications: None  Findings: Normal cervix. Uterine cavity measuring 14 cm with both ostia seen.  Anterior submucosal fibroid noted.  Mirena IUD positioned in the endometrial cavity.  Estimated blood loss: Minimal  Specimens:  ID Type Source Tests Collected by Time Destination  1 : ENDOMETRIAL FIBROIDS GYN Fibroid SURGICAL PATHOLOGY Rosalyn Gess, MD 11/09/2023 1415   2 : ENDOMETRIAL CURETTINGS GYN Endometrium Curettage SURGICAL PATHOLOGY Rosalyn Gess, MD 11/09/2023 1416     Disposition of specimen: Pathology    Procedure: Patient was taken to the OR where she was placed in dorsal lithotomy in stirrups. She voided prior to transport. SCDs were in place.  The patient was prepped and draped in the usual sterile fashion. An adequate timeout was obtained and everyone agreed. A bivalve speculum was placed inside the vagina and the cervix visualized. The cervix was grasped anteriorly with a single-tooth tenaculum. Paracervical block was performed with 1% lidocaine.  The uterus sounded to 14 cm. Sequential cervical dilation was done to 15 fr, and the myosure hysteroscope was introduced into the uterine cavity. The cervix and endometrial lining appeared normal both ostia were seen. Findings as noted.  An anterior submucosal fibroid was seen and resected using the MyoSure. This was also used to sample the entire cavity.  A sharp curettage was then performed to ensure complete sampling of the cavity and was sent to pathology. All instrument, sponge and lap counts were correct x2.  IUD remained in place.  The patient was awakened taken to recovery room in  stable condition.  Rosalyn Gess MD 11/09/2023 2:23 PM

## 2023-11-10 ENCOUNTER — Encounter (HOSPITAL_COMMUNITY): Payer: Self-pay | Admitting: Obstetrics and Gynecology

## 2023-11-10 LAB — SURGICAL PATHOLOGY

## 2023-11-23 ENCOUNTER — Ambulatory Visit: Admitting: Obstetrics and Gynecology

## 2023-11-23 ENCOUNTER — Encounter: Payer: Self-pay | Admitting: Obstetrics and Gynecology

## 2023-11-23 VITALS — BP 132/78 | HR 78 | Wt 200.0 lb

## 2023-11-23 DIAGNOSIS — N939 Abnormal uterine and vaginal bleeding, unspecified: Secondary | ICD-10-CM

## 2023-11-23 DIAGNOSIS — Z09 Encounter for follow-up examination after completed treatment for conditions other than malignant neoplasm: Secondary | ICD-10-CM

## 2023-11-23 NOTE — Progress Notes (Signed)
 53 y.o. [id number] female with AUB-F, Mirena IUD (inserted 10/2020), endometrial polyp on EMB (06/09/2023), HSV 2 weeks s/p diagnostic hysteroscopy, dilation and curettage, polypectomy here for postoperative visit.  Media planner. 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 Two daughters, oldest is in [place], works in Web designer. 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  Patient's last menstrual period was 10/04/2023 (approximate).   At 06/29/2023 appointment, she noted: "Originally seen 05/12/23 with complaints of worsening AUB (longer heavier bleeding, 10d, with clots) despite Mirena IUD and desiring removal.  W/u was completed with revealed: Elevated FSH 33 and  05/17/23 TVUS: 16cm uterus (12cm in 2022) with multiple leiomyoma, largest is 6cm. 2022 [place] noted 6 fibroids, however most recent US only measured 4.  06/09/23 EMB: "Inactive endometrium with progestational changes. Fragments of benign endometrial polyp. Negative for hyperplasia or malignancy. " Pt elected to maintain IUD after completion of w/u.  Today, she is doing well. Still having spotting. Had a light period with for a few days. Denies N/V, abdominal pain, dysuria. Normal BM and voids.  11/09/2023 pathology: A. ENDOMETRIAL FIBROIDS:  - Fragments of benign smooth muscle, consistent with clinically stated  leiomyoma  - No evidence of malignancy   B. ENDOMETRIUM, CURETTAGE:  - Benign endometrial polyp  - Negative for hyperplasia or malignancy    Last PAP:    Component Value Date/Time   DIAGPAP  09/01/2022 1002    - Negative for Intraepithelial Lesions or Malignancy (NILM)   DIAGPAP - Benign reactive/reparative changes 09/01/2022 1002   DIAGPAP - Benign reactive/reparative changes 08/06/2020 1014   DIAGPAP - See comment 08/06/2020 1014   DIAGPAP  08/06/2020 1014    - Negative for Intraepithelial Lesions or Malignancy (NILM)   HPVHIGH Negative 09/01/2022  1002   ADEQPAP  09/01/2022 1002    Satisfactory for evaluation; transformation zone component PRESENT.   ADEQPAP  08/06/2020 1014    Satisfactory for evaluation; transformation zone component PRESENT.   ADEQPAP  01/11/2018 0000    Satisfactory for evaluation  endocervical/transformation zone component PRESENT.   GYN HISTORY: Hysteroscopic myomectomy, polypectomy and IUD insertion on 10/14/2020 H/o HSV  OB History  Gravida Para Term Preterm AB Living  3 2 2  0 1 2  SAB IAB Ectopic Multiple Live Births  0 0 0 0 2    # Outcome Date GA Lbr Len/2nd Weight Sex Type Anes PTL Lv  3 AB           2 Term     F    LIV  1 Term     F    LIV    Past Medical History:  Diagnosis Date   Abnormal pap about 2004   Abnormal uterine bleeding (AUB)    Concussion 2001   no residual   HSV infection    Asymptomatic   Hypertension    Pre-diabetes    Prediabetes    Wears glasses     Past Surgical History:  Procedure Laterality Date   CESAREAN SECTION  04/24/2003   DILATATION & CURETTAGE/HYSTEROSCOPY WITH MYOSURE N/A 11/09/2023   Procedure: DILATATION & CURETTAGE/HYSTEROSCOPY WITH MYOSURE;  Surgeon: Rosalyn Gess, MD;  Location: [facility] OR;  Service: Gynecology;  Laterality: N/A;   HYSTEROSCOPY WITH D & C N/A 10/14/2020   Procedure: DILATATION AND CURETTAGE /HYSTEROSCOPY WITH MYOSURE;  Surgeon: Romualdo Bolk, MD;  Location: [facility];  Service: Gynecology;  Laterality: N/A;  INTRAUTERINE DEVICE (IUD) INSERTION N/A 10/14/2020   Procedure: INTRAUTERINE DEVICE (IUD) INSERTION;  Surgeon: Romualdo Bolk, MD;  Location: [facility];  Service: Gynecology;  Laterality: N/A;  Mirena IUD insertion   WISDOM TOOTH EXTRACTION  early 20's    Current Outpatient Medications on File Prior to Visit  Medication Sig Dispense Refill   acetaminophen (TYLENOL) 500 MG tablet Take 1,000 mg by mouth every 6 (six) hours as needed.     amLODipine (NORVASC) 5 MG tablet Take 5 mg by  mouth in the morning.     Ascorbic Acid (VITAMIN C) 1000 MG tablet Take 1,000 mg by mouth in the morning.     cetirizine (ZYRTEC) 10 MG tablet Take 10 mg by mouth at bedtime.     cholecalciferol (VITAMIN D) 1000 UNITS tablet Take 1,000 Units by mouth in the morning.     levonorgestrel (MIRENA, 52 MG,) 20 MCG/24HR IUD See admin instructions.     Probiotic Product (PROBIOTIC PO) Take 1 capsule by mouth in the morning.     valACYclovir (VALTREX) 500 MG tablet Take one tablet po BID x 3 days prn (Patient taking differently: Take 500 mg by mouth 2 (two) times daily as needed (outbreaks).) 30 tablet 3   No current facility-administered medications on file prior to visit.    No Known Allergies    PE Today's Vitals   11/23/23 1425  BP: 132/78  Pulse: 78  SpO2: 98%  Weight: 200 lb (90.7 kg)   Body mass index is 30.86 kg/m.  Physical Exam Vitals reviewed.  Constitutional:      General: She is not in acute distress.    Appearance: Normal appearance.  HENT:     Head: Normocephalic and atraumatic.     Nose: Nose normal.  Eyes:     Extraocular Movements: Extraocular movements intact.     Conjunctiva/sclera: Conjunctivae normal.  Pulmonary:     Effort: Pulmonary effort is normal.  Abdominal:     General: There is no distension.     Palpations: Abdomen is soft.     Tenderness: There is no abdominal tenderness.  Musculoskeletal:        General: Normal range of motion.     Cervical back: Normal range of motion.  Neurological:     General: No focal deficit present.     Mental Status: She is alert.  Psychiatric:        Mood and Affect: Mood normal.        Behavior: Behavior normal.      AsSessment and Plan:        Postoperative examination  Abnormal uterine bleeding (AUB)  Status post hysteroscopic myomectomy with benign pathology Normal postop exam Maintain Mirena IUD at this time Return office for annual exam   Rosalyn Gess, MD

## 2024-10-24 ENCOUNTER — Ambulatory Visit: Admitting: Obstetrics and Gynecology
